# Patient Record
Sex: Female | Born: 1935 | Race: White | Hispanic: No | Smoking: Never smoker
Health system: Southern US, Community
[De-identification: ages and names within clinical notes are randomized; demographics above are authoritative.]

---

## 2004-06-16 ENCOUNTER — Ambulatory Visit: Payer: Self-pay | Admitting: Internal Medicine

## 2005-02-04 ENCOUNTER — Emergency Department: Payer: Self-pay | Admitting: Emergency Medicine

## 2005-02-28 ENCOUNTER — Inpatient Hospital Stay: Payer: Self-pay | Admitting: Internal Medicine

## 2005-02-28 ENCOUNTER — Other Ambulatory Visit: Payer: Self-pay

## 2006-03-27 ENCOUNTER — Encounter: Payer: Self-pay | Admitting: Orthopaedic Surgery

## 2006-04-03 ENCOUNTER — Encounter: Payer: Self-pay | Admitting: Orthopaedic Surgery

## 2006-04-18 ENCOUNTER — Other Ambulatory Visit: Payer: Self-pay

## 2006-04-18 ENCOUNTER — Emergency Department: Payer: Self-pay | Admitting: Unknown Physician Specialty

## 2006-04-26 ENCOUNTER — Ambulatory Visit: Payer: Self-pay | Admitting: Internal Medicine

## 2006-05-02 ENCOUNTER — Encounter: Payer: Self-pay | Admitting: Orthopaedic Surgery

## 2007-03-28 ENCOUNTER — Ambulatory Visit: Payer: Self-pay | Admitting: Internal Medicine

## 2008-09-12 ENCOUNTER — Ambulatory Visit: Payer: Self-pay | Admitting: Surgery

## 2008-09-18 ENCOUNTER — Ambulatory Visit: Payer: Self-pay | Admitting: Surgery

## 2010-01-11 ENCOUNTER — Ambulatory Visit: Payer: Self-pay | Admitting: Internal Medicine

## 2010-01-20 ENCOUNTER — Ambulatory Visit: Payer: Self-pay | Admitting: Unknown Physician Specialty

## 2010-01-22 ENCOUNTER — Ambulatory Visit: Payer: Self-pay | Admitting: Unknown Physician Specialty

## 2010-01-27 ENCOUNTER — Emergency Department: Payer: Self-pay | Admitting: Emergency Medicine

## 2010-01-30 ENCOUNTER — Observation Stay: Payer: Self-pay | Admitting: Internal Medicine

## 2010-04-07 ENCOUNTER — Ambulatory Visit: Payer: Self-pay | Admitting: Internal Medicine

## 2010-04-12 ENCOUNTER — Ambulatory Visit: Payer: Self-pay | Admitting: Internal Medicine

## 2010-10-27 ENCOUNTER — Ambulatory Visit: Payer: Self-pay | Admitting: Ophthalmology

## 2010-11-24 ENCOUNTER — Ambulatory Visit: Payer: Self-pay | Admitting: Ophthalmology

## 2012-01-30 ENCOUNTER — Emergency Department: Payer: Self-pay | Admitting: Emergency Medicine

## 2012-01-30 LAB — CBC WITH DIFFERENTIAL/PLATELET
Basophil #: 0.1 10*3/uL (ref 0.0–0.1)
Basophil %: 0.8 %
Eosinophil #: 0.1 10*3/uL (ref 0.0–0.7)
HCT: 38.4 % (ref 35.0–47.0)
HGB: 12.9 g/dL (ref 12.0–16.0)
Lymphocyte #: 1.5 10*3/uL (ref 1.0–3.6)
Lymphocyte %: 21.4 %
MCHC: 33.4 g/dL (ref 32.0–36.0)
MCV: 96 fL (ref 80–100)
Monocyte #: 0.7 x10 3/mm (ref 0.2–0.9)
Monocyte %: 9.5 %
Neutrophil #: 4.7 10*3/uL (ref 1.4–6.5)
RDW: 14.9 % — ABNORMAL HIGH (ref 11.5–14.5)
WBC: 7.1 10*3/uL (ref 3.6–11.0)

## 2012-01-31 LAB — COMPREHENSIVE METABOLIC PANEL
Alkaline Phosphatase: 89 U/L (ref 50–136)
Anion Gap: 6 — ABNORMAL LOW (ref 7–16)
BUN: 14 mg/dL (ref 7–18)
Bilirubin,Total: 0.9 mg/dL (ref 0.2–1.0)
Chloride: 106 mmol/L (ref 98–107)
Co2: 27 mmol/L (ref 21–32)
Creatinine: 1.36 mg/dL — ABNORMAL HIGH (ref 0.60–1.30)
EGFR (African American): 42 — ABNORMAL LOW
EGFR (Non-African Amer.): 36 — ABNORMAL LOW
Glucose: 104 mg/dL — ABNORMAL HIGH (ref 65–99)
Osmolality: 278 (ref 275–301)
Potassium: 3.5 mmol/L (ref 3.5–5.1)
Sodium: 139 mmol/L (ref 136–145)
Total Protein: 6.5 g/dL (ref 6.4–8.2)

## 2013-06-09 ENCOUNTER — Emergency Department: Payer: Self-pay | Admitting: Emergency Medicine

## 2013-06-09 LAB — BASIC METABOLIC PANEL
Anion Gap: 6 — ABNORMAL LOW (ref 7–16)
BUN: 16 mg/dL (ref 7–18)
CALCIUM: 9 mg/dL (ref 8.5–10.1)
CHLORIDE: 104 mmol/L (ref 98–107)
CO2: 28 mmol/L (ref 21–32)
Creatinine: 1.14 mg/dL (ref 0.60–1.30)
EGFR (African American): 52 — ABNORMAL LOW
EGFR (Non-African Amer.): 45 — ABNORMAL LOW
GLUCOSE: 83 mg/dL (ref 65–99)
Osmolality: 276 (ref 275–301)
Potassium: 4.1 mmol/L (ref 3.5–5.1)
Sodium: 138 mmol/L (ref 136–145)

## 2013-06-09 LAB — CBC
HCT: 42 % (ref 35.0–47.0)
HGB: 13.8 g/dL (ref 12.0–16.0)
MCH: 32.2 pg (ref 26.0–34.0)
MCHC: 33 g/dL (ref 32.0–36.0)
MCV: 98 fL (ref 80–100)
PLATELETS: 242 10*3/uL (ref 150–440)
RBC: 4.3 10*6/uL (ref 3.80–5.20)
RDW: 14.8 % — ABNORMAL HIGH (ref 11.5–14.5)
WBC: 8.8 10*3/uL (ref 3.6–11.0)

## 2013-06-12 DIAGNOSIS — M533 Sacrococcygeal disorders, not elsewhere classified: Secondary | ICD-10-CM | POA: Insufficient documentation

## 2013-08-21 DIAGNOSIS — I10 Essential (primary) hypertension: Secondary | ICD-10-CM | POA: Diagnosis present

## 2013-08-21 DIAGNOSIS — I2699 Other pulmonary embolism without acute cor pulmonale: Secondary | ICD-10-CM | POA: Diagnosis present

## 2013-08-21 DIAGNOSIS — M1990 Unspecified osteoarthritis, unspecified site: Secondary | ICD-10-CM | POA: Insufficient documentation

## 2013-08-21 DIAGNOSIS — E785 Hyperlipidemia, unspecified: Secondary | ICD-10-CM | POA: Diagnosis present

## 2013-08-21 DIAGNOSIS — I341 Nonrheumatic mitral (valve) prolapse: Secondary | ICD-10-CM | POA: Diagnosis present

## 2013-09-24 ENCOUNTER — Ambulatory Visit: Payer: Self-pay | Admitting: Emergency Medicine

## 2013-09-25 ENCOUNTER — Ambulatory Visit: Payer: Self-pay | Admitting: Neurology

## 2013-12-12 DIAGNOSIS — F0280 Dementia in other diseases classified elsewhere without behavioral disturbance: Secondary | ICD-10-CM | POA: Diagnosis present

## 2015-06-10 DIAGNOSIS — G4733 Obstructive sleep apnea (adult) (pediatric): Secondary | ICD-10-CM | POA: Diagnosis present

## 2017-12-18 DIAGNOSIS — N183 Chronic kidney disease, stage 3 unspecified: Secondary | ICD-10-CM | POA: Insufficient documentation

## 2020-03-30 ENCOUNTER — Emergency Department: Payer: Medicare Other

## 2020-03-30 ENCOUNTER — Encounter: Payer: Self-pay | Admitting: Internal Medicine

## 2020-03-30 ENCOUNTER — Other Ambulatory Visit: Payer: Self-pay

## 2020-03-30 ENCOUNTER — Inpatient Hospital Stay: Payer: Medicare Other

## 2020-03-30 ENCOUNTER — Inpatient Hospital Stay
Admission: EM | Admit: 2020-03-30 | Discharge: 2020-05-01 | DRG: 521 | Disposition: E | Payer: Medicare Other | Attending: Internal Medicine | Admitting: Internal Medicine

## 2020-03-30 DIAGNOSIS — E43 Unspecified severe protein-calorie malnutrition: Secondary | ICD-10-CM | POA: Diagnosis present

## 2020-03-30 DIAGNOSIS — N183 Chronic kidney disease, stage 3 unspecified: Secondary | ICD-10-CM

## 2020-03-30 DIAGNOSIS — G301 Alzheimer's disease with late onset: Secondary | ICD-10-CM | POA: Diagnosis present

## 2020-03-30 DIAGNOSIS — N179 Acute kidney failure, unspecified: Secondary | ICD-10-CM | POA: Diagnosis present

## 2020-03-30 DIAGNOSIS — G4733 Obstructive sleep apnea (adult) (pediatric): Secondary | ICD-10-CM | POA: Diagnosis present

## 2020-03-30 DIAGNOSIS — Z419 Encounter for procedure for purposes other than remedying health state, unspecified: Secondary | ICD-10-CM

## 2020-03-30 DIAGNOSIS — F419 Anxiety disorder, unspecified: Secondary | ICD-10-CM | POA: Diagnosis present

## 2020-03-30 DIAGNOSIS — X58XXXA Exposure to other specified factors, initial encounter: Secondary | ICD-10-CM | POA: Diagnosis present

## 2020-03-30 DIAGNOSIS — D72829 Elevated white blood cell count, unspecified: Secondary | ICD-10-CM | POA: Diagnosis present

## 2020-03-30 DIAGNOSIS — J9601 Acute respiratory failure with hypoxia: Secondary | ICD-10-CM | POA: Diagnosis not present

## 2020-03-30 DIAGNOSIS — N1832 Chronic kidney disease, stage 3b: Secondary | ICD-10-CM | POA: Diagnosis present

## 2020-03-30 DIAGNOSIS — J189 Pneumonia, unspecified organism: Secondary | ICD-10-CM | POA: Diagnosis not present

## 2020-03-30 DIAGNOSIS — R000 Tachycardia, unspecified: Secondary | ICD-10-CM | POA: Diagnosis not present

## 2020-03-30 DIAGNOSIS — E785 Hyperlipidemia, unspecified: Secondary | ICD-10-CM | POA: Diagnosis present

## 2020-03-30 DIAGNOSIS — I872 Venous insufficiency (chronic) (peripheral): Secondary | ICD-10-CM | POA: Diagnosis present

## 2020-03-30 DIAGNOSIS — Z20822 Contact with and (suspected) exposure to covid-19: Secondary | ICD-10-CM | POA: Diagnosis present

## 2020-03-30 DIAGNOSIS — I129 Hypertensive chronic kidney disease with stage 1 through stage 4 chronic kidney disease, or unspecified chronic kidney disease: Secondary | ICD-10-CM | POA: Diagnosis present

## 2020-03-30 DIAGNOSIS — D689 Coagulation defect, unspecified: Secondary | ICD-10-CM | POA: Diagnosis not present

## 2020-03-30 DIAGNOSIS — M1990 Unspecified osteoarthritis, unspecified site: Secondary | ICD-10-CM | POA: Diagnosis present

## 2020-03-30 DIAGNOSIS — Z86711 Personal history of pulmonary embolism: Secondary | ICD-10-CM

## 2020-03-30 DIAGNOSIS — Z66 Do not resuscitate: Secondary | ICD-10-CM | POA: Diagnosis not present

## 2020-03-30 DIAGNOSIS — Z6823 Body mass index (BMI) 23.0-23.9, adult: Secondary | ICD-10-CM | POA: Diagnosis not present

## 2020-03-30 DIAGNOSIS — H5789 Other specified disorders of eye and adnexa: Secondary | ICD-10-CM | POA: Diagnosis present

## 2020-03-30 DIAGNOSIS — I341 Nonrheumatic mitral (valve) prolapse: Secondary | ICD-10-CM | POA: Diagnosis present

## 2020-03-30 DIAGNOSIS — I2699 Other pulmonary embolism without acute cor pulmonale: Secondary | ICD-10-CM | POA: Diagnosis present

## 2020-03-30 DIAGNOSIS — M160 Bilateral primary osteoarthritis of hip: Secondary | ICD-10-CM | POA: Diagnosis present

## 2020-03-30 DIAGNOSIS — F05 Delirium due to known physiological condition: Secondary | ICD-10-CM | POA: Diagnosis not present

## 2020-03-30 DIAGNOSIS — S72001A Fracture of unspecified part of neck of right femur, initial encounter for closed fracture: Secondary | ICD-10-CM

## 2020-03-30 DIAGNOSIS — I10 Essential (primary) hypertension: Secondary | ICD-10-CM | POA: Diagnosis present

## 2020-03-30 DIAGNOSIS — F0280 Dementia in other diseases classified elsewhere without behavioral disturbance: Secondary | ICD-10-CM | POA: Diagnosis present

## 2020-03-30 DIAGNOSIS — I469 Cardiac arrest, cause unspecified: Secondary | ICD-10-CM | POA: Diagnosis not present

## 2020-03-30 DIAGNOSIS — G9341 Metabolic encephalopathy: Secondary | ICD-10-CM | POA: Diagnosis not present

## 2020-03-30 DIAGNOSIS — Z8249 Family history of ischemic heart disease and other diseases of the circulatory system: Secondary | ICD-10-CM

## 2020-03-30 DIAGNOSIS — R0902 Hypoxemia: Secondary | ICD-10-CM

## 2020-03-30 DIAGNOSIS — Z515 Encounter for palliative care: Secondary | ICD-10-CM

## 2020-03-30 DIAGNOSIS — Z7901 Long term (current) use of anticoagulants: Secondary | ICD-10-CM

## 2020-03-30 DIAGNOSIS — Z79899 Other long term (current) drug therapy: Secondary | ICD-10-CM

## 2020-03-30 DIAGNOSIS — M109 Gout, unspecified: Secondary | ICD-10-CM | POA: Diagnosis present

## 2020-03-30 DIAGNOSIS — I959 Hypotension, unspecified: Secondary | ICD-10-CM | POA: Diagnosis not present

## 2020-03-30 DIAGNOSIS — S72011A Unspecified intracapsular fracture of right femur, initial encounter for closed fracture: Principal | ICD-10-CM | POA: Diagnosis present

## 2020-03-30 LAB — CBC
HCT: 40.5 % (ref 36.0–46.0)
Hemoglobin: 13 g/dL (ref 12.0–15.0)
MCH: 31.6 pg (ref 26.0–34.0)
MCHC: 32.1 g/dL (ref 30.0–36.0)
MCV: 98.5 fL (ref 80.0–100.0)
Platelets: 269 10*3/uL (ref 150–400)
RBC: 4.11 MIL/uL (ref 3.87–5.11)
RDW: 14.2 % (ref 11.5–15.5)
WBC: 10.8 10*3/uL — ABNORMAL HIGH (ref 4.0–10.5)
nRBC: 0 % (ref 0.0–0.2)

## 2020-03-30 LAB — COMPREHENSIVE METABOLIC PANEL
ALT: 12 U/L (ref 0–44)
AST: 35 U/L (ref 15–41)
Albumin: 3 g/dL — ABNORMAL LOW (ref 3.5–5.0)
Alkaline Phosphatase: 89 U/L (ref 38–126)
Anion gap: 9 (ref 5–15)
BUN: 39 mg/dL — ABNORMAL HIGH (ref 8–23)
CO2: 21 mmol/L — ABNORMAL LOW (ref 22–32)
Calcium: 9.3 mg/dL (ref 8.9–10.3)
Chloride: 109 mmol/L (ref 98–111)
Creatinine, Ser: 1.81 mg/dL — ABNORMAL HIGH (ref 0.44–1.00)
GFR, Estimated: 26 mL/min — ABNORMAL LOW (ref >60)
Glucose, Bld: 156 mg/dL — ABNORMAL HIGH (ref 70–99)
Potassium: 4.7 mmol/L (ref 3.5–5.1)
Sodium: 139 mmol/L (ref 135–145)
Total Bilirubin: 1.6 mg/dL — ABNORMAL HIGH (ref 0.3–1.2)
Total Protein: 6.7 g/dL (ref 6.5–8.1)

## 2020-03-30 LAB — PROTIME-INR
INR: 3.5 — ABNORMAL HIGH (ref 0.8–1.2)
Prothrombin Time: 34 seconds — ABNORMAL HIGH (ref 11.4–15.2)

## 2020-03-30 LAB — APTT: aPTT: 44 seconds — ABNORMAL HIGH (ref 24–36)

## 2020-03-30 MED ORDER — ONDANSETRON HCL 4 MG/2ML IJ SOLN
4.0000 mg | Freq: Once | INTRAMUSCULAR | Status: AC
  Administered 2020-03-30: 4 mg via INTRAVENOUS
  Filled 2020-03-30: qty 2

## 2020-03-30 MED ORDER — SODIUM CHLORIDE 0.9 % IV SOLN: INTRAVENOUS | Status: AC

## 2020-03-30 MED ORDER — POLYETHYLENE GLYCOL 3350 17 G PO PACK
17.0000 g | PACK | Freq: Every day | ORAL | Status: DC | PRN
  Administered 2020-04-01: 17 g via ORAL
  Filled 2020-03-30: qty 1

## 2020-03-30 MED ORDER — MORPHINE SULFATE (PF) 2 MG/ML IV SOLN
2.0000 mg | Freq: Once | INTRAVENOUS | Status: AC
Start: 2020-03-30 — End: 2020-03-30
  Administered 2020-03-30: 2 mg via INTRAVENOUS
  Filled 2020-03-30: qty 1

## 2020-03-30 MED ORDER — MEMANTINE HCL 10 MG PO TABS
10.0000 mg | ORAL_TABLET | Freq: Two times a day (BID) | ORAL | Status: DC
  Administered 2020-03-31 – 2020-04-01 (×4): 10 mg via ORAL
  Filled 2020-03-30 (×3): qty 2
  Filled 2020-03-30 (×2): qty 1
  Filled 2020-03-30: qty 2

## 2020-03-30 MED ORDER — HYDROCODONE-ACETAMINOPHEN 5-325 MG PO TABS
1.0000 | ORAL_TABLET | Freq: Four times a day (QID) | ORAL | Status: DC | PRN
  Administered 2020-03-31 – 2020-04-01 (×4): 1 via ORAL
  Filled 2020-03-30 (×4): qty 1

## 2020-03-30 MED ORDER — METOPROLOL TARTRATE 25 MG PO TABS
25.0000 mg | ORAL_TABLET | Freq: Two times a day (BID) | ORAL | Status: DC
  Administered 2020-03-31 – 2020-04-01 (×4): 25 mg via ORAL
  Filled 2020-03-30 (×5): qty 1

## 2020-03-30 MED ORDER — SIMVASTATIN 20 MG PO TABS
40.0000 mg | ORAL_TABLET | Freq: Every day | ORAL | Status: DC
  Administered 2020-03-31 – 2020-04-01 (×2): 40 mg via ORAL
  Filled 2020-03-30 (×2): qty 2

## 2020-03-30 MED ORDER — HYDROMORPHONE HCL 1 MG/ML IJ SOLN
0.5000 mg | INTRAMUSCULAR | Status: DC | PRN
  Administered 2020-03-30 – 2020-04-02 (×6): 0.5 mg via INTRAVENOUS
  Filled 2020-03-30 (×6): qty 1

## 2020-03-30 MED ORDER — DONEPEZIL HCL 5 MG PO TABS
10.0000 mg | ORAL_TABLET | Freq: Every day | ORAL | Status: DC
  Administered 2020-03-31 – 2020-04-01 (×2): 10 mg via ORAL
  Filled 2020-03-30 (×3): qty 2

## 2020-03-30 NOTE — ED Triage Notes (Signed)
Per EMS pt is complaining of left sided hip pain, pt denies any falls.   EMS stated pt was seen for PT today but unable to determine why pt had PT

## 2020-03-30 NOTE — ED Provider Notes (Signed)
[facility] Emergency Department Provider Note   ____________________________________________    I have reviewed the triage vital signs and the nursing notes.   HISTORY  Chief Complaint Hip Pain (Per EMS pt complains of left sided hip pain)   History severely limited by dementia.  HPI ZYION LEIDNER is a 87 y.o. female with history of Alzheimer's dementia, who lives with her daughter and is cared for by her daughter.  Daughter reports patient has not been ambulating over the last week, had a telemedicine visit with PCP who recommended physical therapy however the patient has not been able to stand and bear weight.  Typically is able to ambulate well with her walker.  No falls reported.  Patient has a history of Alzheimer's dementia, chronic kidney disease, PE on Xarelto, osteoarthritis There are no problems to display for this patient.     Prior to Admission medications   Not on File     Allergies Patient has no allergy information on record.  No family history on file.  Social History Patient cared for by daughter, non-smoker    Review of systems Constitutional: No fevers reported Eyes: No discharge ENT: No epistaxis Cardiovascular: Denies chest pain. Respiratory: Denies shortness of breath. Gastrointestinal: No abdominal pain.  No nausea, no vomiting.   Genitourinary: Negative for dysuria. Musculoskeletal: Hip pain Skin: Negative for rash. Neurological: Negative for headaches   ____________________________________________   PHYSICAL EXAM:  VITAL SIGNS: ED Triage Vitals  Enc Vitals Group     BP 04-14-20 1832 (!) 179/96     Pulse Rate 04/14/20 1832 94     Resp 04/14/20 1832 18     Temp April 14, 2020 1832 99.3 F (37.4 C)     Temp Source April 14, 2020 1832 Oral     SpO2 14-Apr-2020 1832 98 %     Weight 2020-04-14 1836 54.4 kg (120 lb)     Height 04/14/2020 1836 1.524 m (5')     Head Circumference --      Peak Flow --      Pain Score --       Pain Loc --      Pain Edu? --      Excl. in GC? --     Constitutional: Alert, disoriented, no acute distress Eyes: Conjunctivae are normal.  Head: Atraumatic. Nose: No congestion/rhinnorhea. Mouth/Throat: Mucous membranes are moist.    Cardiovascular: Normal rate, regular rhythm.  Good peripheral circulation. Respiratory: Normal respiratory effort.  No retractions. Lungs CTAB. Gastrointestinal: Soft and nontender. No distention.  No CVA tenderness.  Musculoskeletal: Patient with painful range of motion of the lower extremities, complains of pain with axial load on both hips, left greater than right.  No tenderness to palpation of the left hip, 2+ pulses distally bilaterally Neurologic:  No gross focal neurologic deficits are appreciated.  Skin:  Skin is warm, dry and intact. No rash noted. Psychiatric: Mood and affect are normal. Speech and behavior are normal.  ____________________________________________   LABS (all labs ordered are listed, but only abnormal results are displayed)  Labs Reviewed  CBC - Abnormal; Notable for the following components:      Result Value   WBC 10.8 (*)    All other components within normal limits  SARS CORONAVIRUS 2 (TAT 6-24 HRS)  COMPREHENSIVE METABOLIC PANEL  APTT  PROTIME-INR   ____________________________________________  EKG   ____________________________________________  RADIOLOGY  Pelvis x-ray reviewed by me, right subcapital hip fracture ____________________________________________   PROCEDURES  Procedure(s) performed:  No  Procedures   Critical Care performed: No ____________________________________________   INITIAL IMPRESSION / ASSESSMENT AND PLAN / ED COURSE  Pertinent labs & imaging results that were available during my care of the patient were reviewed by me and considered in my medical decision making (see chart for details).  Patient presents with refusal to walk/inability to walk.  No trauma  reported.  Pain appears to be primarily left hip although possibly some pain in the right hip as well, daughter is confident there not been any falls   Pelvis x-ray demonstrates right subcapital hip fracture, patient treated with IV morphine and IV Zofran with good relief.  Discussed with Dr. Odis Luster of orthopedic surgery, patient is on Xarelto,  We will admit to the hospitalist service    ____________________________________________   FINAL CLINICAL IMPRESSION(S) / ED DIAGNOSES  Final diagnoses:  Closed fracture of right hip, initial encounter [facility])        Note:  This document was prepared using Dragon voice recognition software and may include unintentional dictation errors.   Jene Every, MD 03/27/2020 Barry Brunner

## 2020-03-30 NOTE — H&P (Signed)
History and Physical   Debra Edelmanunice N Shadix RUE:[id number]:[id number] DOB: 1928-09-07 DOA: 03/25/2020  PCP: Gracelyn NurseJohnston, John D, MD   Patient coming from: Home  Chief Complaint: Hip pain, not ambulating  HPI: Debra Orr is a 87 y.o. female with medical history significant of dementia, anxiety, cataracts, CKD 3, diverticulosis, hemorrhoids, hyperlipidemia, hypertension, PE on Xarelto, mitral valve prolapse, sleep apnea, venous insufficiency who presents with difficulty walking for about a week.  History obtained with the assistance of patient's daughter and chart review due to patient's dementia.  Patient lives alone but her daughter comes to check on her at least 3 times a week.  Her daughter states there is also another caregiver who helps on the other days. Patient was noted being unable to walk for about a week.  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  Upon evaluation by physical therapist patient was unable to bear weight due to pain and recommended that patient seek medical attention.  At baseline patient is able to walk with her walker.  Patient's daughter states that the patient has had decreased p.o. intake because she is not getting up and around recently.  Patient's daughter also states that she has concerns if the patient needs surgery as she had surgery 10 years ago and she had difficulty waking up after anesthesia.  Patient's daughter and patient states that not noticed any nausea, chest pain, shortness of breath, abdominal pain.   ED Course: Vital signs in ED significant for blood pressure in the 170 systolic.  Lab work-up showed BMP with bicarb 21, BUN 39, creatinine 1.81 which is elevated from her previous baseline of around 1.4.  LFTs showed albumin of 3, T bili 1.6.  CBC showed leukocytosis  to 10.8.  PT, PTT, INR were pending.  Respiratory panel flu COVID pending.  Chest x-ray showed a right femoral subcapital neck fracture.  Patient given pain control in the ED and orthopedics was consulted who will see the patient tomorrow.  Review of Systems: As per HPI otherwise all other systems reviewed and are negative.  History reviewed. No pertinent past medical history. Dementia Anxiety Cataract CKD Diverticulosis Hemorrhoids Hyperlipidemia Hypertension PE on Xarelto Gout Mitral valve prolapse Sleep apnea Venous insufficiency  History reviewed. No pertinent surgical history.  Social History  has no history on file for tobacco use, alcohol use, and drug use.  Not on File  Family History  Problem Relation Age of Onset  . Hypertension Mother   Reviewed on admission  Prior to Admission medications   Not on File  Allopurinol Donepezil 10 mg daily Namenda 10 mg twice daily Ativan 0.5 mg 3 times daily as needed Potassium segmentation Xarelto daily Simvastatin 40 mg daily Metoprolol 25 mg twice daily Ambien 5 mg nightly as needed  Physical Exam: Vitals:   03/16/2020 1832 03/05/2020 1836  BP: (!) 179/96   Pulse: 94   Resp: 18   Temp: 99.3 F (37.4 C)   TempSrc: Oral   SpO2: 98%   Weight:  54.4 kg  Height:  5' (1.524 m)   Physical Exam Constitutional:      General: She is not in acute distress.    Appearance: Normal appearance.     Comments: Somewhat tired appearing elderly female  HENT:     Head: Normocephalic and atraumatic.  Mouth/Throat:     Mouth: Mucous membranes are moist.     Pharynx: Oropharynx is clear.  Eyes:     Extraocular Movements: Extraocular movements intact.     Pupils: Pupils are equal, round, and reactive to light.  Cardiovascular:     Rate and Rhythm: Normal rate and regular rhythm.     Pulses: Normal pulses.     Heart sounds: Normal heart sounds.  Pulmonary:     Effort: Pulmonary effort is normal. No respiratory distress.      Breath sounds: Normal breath sounds.  Abdominal:     General: Bowel sounds are normal. There is no distension.     Palpations: Abdomen is soft.     Tenderness: There is no abdominal tenderness.  Musculoskeletal:        General: Tenderness (Right hip) present. No swelling or deformity.     Comments: Bilateral lower extremities are neurovascular intact  Skin:    General: Skin is warm and dry.  Neurological:     General: No focal deficit present.     Mental Status: Mental status is at baseline.    Labs on Admission: I have personally reviewed following labs and imaging studies  CBC: Recent Labs  Lab 03/06/2020 1903  WBC 10.8*  HGB 13.0  HCT 40.5  MCV 98.5  PLT 269    Basic Metabolic Panel: Recent Labs  Lab 03/05/2020 1903  NA 139  K 4.7  CL 109  CO2 21*  GLUCOSE 156*  BUN 39*  CREATININE 1.81*  CALCIUM 9.3    GFR: Estimated Creatinine Clearance: 15.1 mL/min (A) (by C-G formula based on SCr of 1.81 mg/dL (H)).  Liver Function Tests: Recent Labs  Lab 03/23/2020 1903  AST 35  ALT 12  ALKPHOS 89  BILITOT 1.6*  PROT 6.7  ALBUMIN 3.0*    Urine analysis: No results found for: COLORURINE, APPEARANCEUR, LABSPEC, PHURINE, GLUCOSEU, HGBUR, BILIRUBINUR, KETONESUR, PROTEINUR, UROBILINOGEN, NITRITE, LEUKOCYTESUR  Radiological Exams on Admission: Chest Portable 1 View  Result Date: 03/27/2020 CLINICAL DATA:  left-sided hip pain. EXAM: PORTABLE CHEST 1 VIEW COMPARISON:  January 30, 2010 FINDINGS: The heart size and mediastinal contours are mildly enlarged. Aortic knob calcifications are seen. There is elevation of the right hemidiaphragm. Increased interstitial markings seen at both lung apices bases, likely chronic lung changes. No large airspace consolidation or pleural effusion. No acute osseous abnormality. IMPRESSION: No active disease. Electronically Signed   By: Jonna Clark M.D.   On: 03/03/2020 20:12   DG Hip Unilat With Pelvis 2-3 Views Left  Result Date:  03/28/2020 CLINICAL DATA:  Left-sided hip pain EXAM: DG HIP (WITH OR WITHOUT PELVIS) 2-3V LEFT COMPARISON:  None. FINDINGS: Frontal view of the pelvis as well as frontal and frogleg lateral views of the left hip are obtained. The bones are diffusely osteopenic. There is evidence of a subcapital right femoral neck fracture. Please correlate with any history of prior trauma for current symptoms. Dedicated views of the right hip may be useful. The left hip is well aligned with no acute bony abnormality. There is moderate bilateral hip osteoarthritis. Significant lower lumbar spondylosis. A large amount of stool within the rectal vault. IMPRESSION: 1. Evidence of subcapital right femoral neck fracture as above. Dedicated right hip views may be useful. 2. No evidence of left hip fracture.  Severe osteopenia. 3. Bilateral hip osteoarthritis. Electronically Signed   By: Sharlet Salina M.D.   On: 03/12/2020 19:00   EKG: Independently reviewed.  Sinus tachycardia with  PVCs intraventricular conduction delay.  Significant baseline wander.  Q waves in V1 and V2 are present on EKG in 2015.  Assessment/Plan Principal Problem:   Closed subcapital fracture of femur, right, initial encounter (HCC) Active Problems:   Hyperlipidemia   Hypertension   Late onset Alzheimer's disease without behavioral disturbance (HCC)   Mitral valve prolapse   OSA (obstructive sleep apnea)   Pulmonary embolism (HCC)   Acute renal failure superimposed on stage 3 chronic kidney disease (HCC)  Subcapital femur fracture on the right > Noted on x-ray as patient was being evaluated for inability to ambulate at home and unable to bear weight for PT. > No known fall. > Orthopedics consulted in the ED and will see patient tomorrow, she will not have surgery tomorrow morning as she is on Xarelto for prior PE will need washout. - Admit to MedSurg - Pain control with as needed Norco and Dilaudid for breakthrough - Bedrest - Holding Xarelto -  Continuous pulse ox - Daughter states she would like to talk with surgeon before surgery as she has concerns about general anesthesia due to patient having difficulties waking up after general anesthesia 10 years ago.  AKI on CKD 3 > Creatinine elevated to 1.8 from a baseline of around 1.4 per chart review. - Give IV fluids overnight - Avoid nephrotoxic agents - Trend renal function electrolytes  Leukocytosis > No fever or clear signs of infection - Check chest x-ray and urinalysis  Dementia - Continue home donepezil and Namenda  History of arrhythmia (type not listed in chart) Hypertension > Hypertensive to the 170s systolic in ED - Not on home antihypertensives - This pain improved with pain control - Continue home metoprolol 25 mg twice daily - Consider PRN antihypertensive if this does not improve with her home metoprolol and pain control  Hyperlipidemia - Continue home simvastatin  Anxiety Sleep disturbance OSA - Holding home Ativan and Ambien considering her age and on pain control - Nonadherent to CPAP  Gout -Reportedly takes allopurinol however this was not on her most recent note in Care Everywhere.  Will await pharmacy med rec.  History of PE - Holding Xarelto for now as above  Eye irritation versus ?infection > Daughter reports history of this and reports she is on eyedrop - We will await pharmacy reconciliation to determine right eyedrops to continue  DVT prophylaxis: SCDs  Code Status:   Full Family Communication:  Discussed with daughter who is POA Disposition Plan:   Patient is from:  Home  Anticipated DC to:  Pending clinical course  Anticipated DC date:  2 to 3 days  Anticipated DC barriers: None  Consults called:  Orthopedics, Dr. Odis Luster consulted by EDP.   Admission status:  Inpatient, MedSurg   Severity of Illness: The appropriate patient status for this patient is OBSERVATION. Observation status is judged to be reasonable and necessary in  order to provide the required intensity of service to ensure the patient's safety. The patient's presenting symptoms, physical exam findings, and initial radiographic and laboratory data in the context of their medical condition is felt to place them at decreased risk for further clinical deterioration. Furthermore, it is anticipated that the patient will be medically stable for discharge from the hospital within 2 midnights of admission. The following factors support the patient status of observation.   " The patient's presenting symptoms include hip pain, difficulty walking. " The physical exam findings include right hip tenderness. " The initial radiographic and laboratory data are  creatinine 1.1, leukocytosis of 10.8, x-ray with subcapital right femur fracture.  Synetta Fail MD [facility]  How to contact the [facility] Attending or Consulting provider 7A - 7P or covering provider during after hours 7P -7A, for this patient?   1. Check the care team in [facility] and look for a) attending/consulting [facility] provider listed and b) the [facility] team listed 2. Log into [email or web address] and use [place]'s universal password to access. If you do not have the password, please contact the hospital operator. 3. Locate the [facility] provider you are looking for under [facility] and page to a number that you can be directly reached. 4. If you still have difficulty reaching the provider, please page the [facility] (Director on Call) for the Hospitalists listed on amion for assistance.  03/23/2020, 8:34 PM

## 2020-03-30 NOTE — ED Notes (Signed)
Patient transported to X-ray 

## 2020-03-31 ENCOUNTER — Encounter: Payer: Self-pay | Admitting: Internal Medicine

## 2020-03-31 ENCOUNTER — Other Ambulatory Visit: Payer: Self-pay

## 2020-03-31 DIAGNOSIS — S72011A Unspecified intracapsular fracture of right femur, initial encounter for closed fracture: Secondary | ICD-10-CM | POA: Diagnosis not present

## 2020-03-31 LAB — CBC
HCT: 35.4 % — ABNORMAL LOW (ref 36.0–46.0)
Hemoglobin: 11.9 g/dL — ABNORMAL LOW (ref 12.0–15.0)
MCH: 33.1 pg (ref 26.0–34.0)
MCHC: 33.6 g/dL (ref 30.0–36.0)
MCV: 98.6 fL (ref 80.0–100.0)
Platelets: 204 10*3/uL (ref 150–400)
RBC: 3.59 MIL/uL — ABNORMAL LOW (ref 3.87–5.11)
RDW: 14.2 % (ref 11.5–15.5)
WBC: 10.9 10*3/uL — ABNORMAL HIGH (ref 4.0–10.5)
nRBC: 0 % (ref 0.0–0.2)

## 2020-03-31 LAB — BASIC METABOLIC PANEL
Anion gap: 9 (ref 5–15)
BUN: 42 mg/dL — ABNORMAL HIGH (ref 8–23)
CO2: 21 mmol/L — ABNORMAL LOW (ref 22–32)
Calcium: 8.8 mg/dL — ABNORMAL LOW (ref 8.9–10.3)
Chloride: 114 mmol/L — ABNORMAL HIGH (ref 98–111)
Creatinine, Ser: 1.9 mg/dL — ABNORMAL HIGH (ref 0.44–1.00)
GFR, Estimated: 25 mL/min — ABNORMAL LOW (ref >60)
Glucose, Bld: 127 mg/dL — ABNORMAL HIGH (ref 70–99)
Potassium: 4.5 mmol/L (ref 3.5–5.1)
Sodium: 144 mmol/L (ref 135–145)

## 2020-03-31 LAB — TYPE AND SCREEN
ABO/RH(D): O POS
Antibody Screen: NEGATIVE

## 2020-03-31 LAB — SARS CORONAVIRUS 2 (TAT 6-24 HRS): SARS Coronavirus 2: NEGATIVE

## 2020-03-31 LAB — SURGICAL PCR SCREEN
MRSA, PCR: NEGATIVE
Staphylococcus aureus: NEGATIVE

## 2020-03-31 MED ORDER — ALLOPURINOL 100 MG PO TABS
100.0000 mg | ORAL_TABLET | Freq: Every day | ORAL | Status: DC
  Administered 2020-04-01: 10:00:00 100 mg via ORAL
  Filled 2020-03-31 (×5): qty 1

## 2020-03-31 MED ORDER — CIPROFLOXACIN HCL 0.3 % OP SOLN
1.0000 [drp] | Freq: Three times a day (TID) | OPHTHALMIC | Status: DC
  Administered 2020-03-31 – 2020-04-02 (×5): 1 [drp] via OPHTHALMIC
  Filled 2020-03-31 (×2): qty 2.5

## 2020-03-31 MED ORDER — ZOLPIDEM TARTRATE 5 MG PO TABS
5.0000 mg | ORAL_TABLET | Freq: Every evening | ORAL | Status: DC | PRN
  Administered 2020-03-31 – 2020-04-01 (×2): 5 mg via ORAL
  Filled 2020-03-31 (×2): qty 1

## 2020-03-31 MED ORDER — METOPROLOL TARTRATE 5 MG/5ML IV SOLN
5.0000 mg | Freq: Once | INTRAVENOUS | Status: AC
  Administered 2020-03-31: 5 mg via INTRAVENOUS
  Filled 2020-03-31: qty 5

## 2020-03-31 MED ORDER — LORAZEPAM 1 MG PO TABS: 0.5000 mg | ORAL_TABLET | Freq: Three times a day (TID) | ORAL | Status: DC | PRN

## 2020-03-31 NOTE — Progress Notes (Signed)
PROGRESS NOTE    Debra Orr  QPY:[id number] DOB: 01-31-1929 DOA: 03/22/2020 PCP: Gracelyn Nurse, MD   Brief Narrative:   87 y.o. female with medical history significant of dementia, anxiety, cataracts, CKD 3, diverticulosis, hemorrhoids, hyperlipidemia, hypertension, PE on Xarelto, mitral valve prolapse, sleep apnea, venous insufficiency who presents with difficulty walking for about a week.              History obtained with the assistance of patient's daughter and chart review due to patient's dementia.  Patient lives alone but her daughter comes to check on her at least 3 times a week.  Her daughter states there is also another caregiver who helps on the other days. Patient was noted being unable to walk for about a week.  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  Upon evaluation by physical therapist patient was unable to bear weight due to pain and recommended that patient seek medical attention.  At baseline patient is able to walk with her walker.   Assessment & Plan:   Principal Problem:   Closed subcapital fracture of femur, right, initial encounter (HCC) Active Problems:   Hyperlipidemia   Hypertension   Late onset Alzheimer's disease without behavioral disturbance (HCC)   Mitral valve prolapse   OSA (obstructive sleep apnea)   Pulmonary embolism (HCC)   Acute renal failure superimposed on stage 3 chronic kidney disease (HCC)  Right-sided subcapital femur fracture Noted on x-ray after inability walk for a week Also cannot bear weight No known fall Orthopedics consulted in ED Patient has been on Xarelto for PE Plan: Okay for diet Hold Xarelto Multimodal pain control Per orthopedic note plan for operative fixation 3/3  Acute kidney injury on chronic kidney  disease stage IIIb Creatinine elevated to 1.9 Baseline creatinine 1.4 Plan: Continue IV fluids for tonight Avoid nonessential nephrotoxins Daily BMP Consider nephrology consult if creatinine continues to rise Monitor urine output  Coagulopathy INR 3.5 Unclear etiology, possibly related to Xarelto Difficult to interpret coagulation parameters in the setting of NOAC use No obvious bleeding Plan: Xarelto on hold Daily INR  Dementia - Continue home donepezil and Namenda  History of arrhythmia (type not listed in chart) Hypertension > Hypertensive to the 170s systolic in ED - Not on home antihypertensives - This pain improved with pain control - Continue home metoprolol 25 mg twice daily - Consider PRN antihypertensive if this does not improve with her home metoprolol and pain control  Hyperlipidemia - Continue home simvastatin  Anxiety Sleep disturbance OSA - Continue home ambien and ativan - Cautious monitoring of respiratory status - Nonadherent to CPAP  Gout -Continue home allopurinol  History of PE - Holding Xarelto for now as above  Eye irritation versus ?infection > Daughter reports history of this and reports she is on eyedrop -Pharmacy verified patient is on ciprofloxacin eyedrop -We will continue per home dosing regimen   DVT prophylaxis: SCDs Code Status: Full Family Communication: None today.  Orthopedic surgery spoke with family Disposition Plan: Status is: Inpatient  Remains inpatient appropriate because:Inpatient level of care appropriate due to severity of illness   Dispo: The patient is from: Home              Anticipated d/c is to: SNF  Patient currently is not medically stable to d/c.   Difficult to place patient No  Hip fracture.  Plan for operative repair 3/3.  Orthopedics on consult.  Anticipate need for disposition to skilled nursing facility.     Level of care: Med-Surg  Consultants:   Orthopedic  surgery  Procedures:   None, hip fracture repair planned  Antimicrobials:   None   Subjective: Seen and examined.  History limited by underlying dementia.  Patient appears in no visible distress  Objective: Vitals:   03/31/20 1100 03/31/20 1200 03/31/20 1336 03/31/20 1535  BP: (!) 162/91 111/75 (!) 116/91 (!) 153/77  Pulse: 79 (!) 109 97 (!) 105  Resp:  20 18 17   Temp:  98 F (36.7 C) 98.6 F (37 C) 98.9 F (37.2 C)  TempSrc:  Oral    SpO2: 95% 97% 95% 94%  Weight:      Height:       No intake or output data in the 24 hours ending 03/31/20 1553 Filed Weights   03/29/2020 1836  Weight: 54.4 kg    Examination:  General exam: No acute distress.  Appears frail Respiratory system: Clear to auscultation. Respiratory effort normal. Cardiovascular system: S1 & S2 heard, RRR. No JVD, murmurs, rubs, gallops or clicks. No pedal edema. Gastrointestinal system: Abdomen is nondistended, soft and nontender. No organomegaly or masses felt. Normal bowel sounds heard. Central nervous system: Alert and oriented x1. No focal neurological deficits. Extremities: Tenderness to right hip.  Palpable pedal pulses bilaterally Skin: No rashes, lesions or ulcers Psychiatry: Judgement and insight appear impaired. Mood & affect flattened.     Data Reviewed: I have personally reviewed following labs and imaging studies  CBC: Recent Labs  Lab 03/22/2020 1903 03/31/20 0623  WBC 10.8* 10.9*  HGB 13.0 11.9*  HCT 40.5 35.4*  MCV 98.5 98.6  PLT 269 204   Basic Metabolic Panel: Recent Labs  Lab 03/10/2020 1903 03/31/20 0623  NA 139 144  K 4.7 4.5  CL 109 114*  CO2 21* 21*  GLUCOSE 156* 127*  BUN 39* 42*  CREATININE 1.81* 1.90*  CALCIUM 9.3 8.8*   GFR: Estimated Creatinine Clearance: 14.4 mL/min (A) (by C-G formula based on SCr of 1.9 mg/dL (H)). Liver Function Tests: Recent Labs  Lab 03/16/2020 1903  AST 35  ALT 12  ALKPHOS 89  BILITOT 1.6*  PROT 6.7  ALBUMIN 3.0*   No  results for input(s): LIPASE, AMYLASE in the last 168 hours. No results for input(s): AMMONIA in the last 168 hours. Coagulation Profile: Recent Labs  Lab 03/16/2020 1903  INR 3.5*   Cardiac Enzymes: No results for input(s): CKTOTAL, CKMB, CKMBINDEX, TROPONINI in the last 168 hours. BNP (last 3 results) No results for input(s): PROBNP in the last 8760 hours. HbA1C: No results for input(s): HGBA1C in the last 72 hours. CBG: No results for input(s): GLUCAP in the last 168 hours. Lipid Profile: No results for input(s): CHOL, HDL, LDLCALC, TRIG, CHOLHDL, LDLDIRECT in the last 72 hours. Thyroid Function Tests: No results for input(s): TSH, T4TOTAL, FREET4, T3FREE, THYROIDAB in the last 72 hours. Anemia Panel: No results for input(s): VITAMINB12, FOLATE, FERRITIN, TIBC, IRON, RETICCTPCT in the last 72 hours. Sepsis Labs: No results for input(s): PROCALCITON, LATICACIDVEN in the last 168 hours.  Recent Results (from the past 240 hour(s))  SARS CORONAVIRUS 2 (TAT 6-24 HRS) Nasopharyngeal Nasopharyngeal Swab     Status: None   Collection Time: 03/15/2020  7:21 PM   Specimen: Nasopharyngeal  Swab  Result Value Ref Range Status   SARS Coronavirus 2 NEGATIVE NEGATIVE Final    Comment: (NOTE) SARS-CoV-2 target nucleic acids are NOT DETECTED.  The SARS-CoV-2 RNA is generally detectable in upper and lower respiratory specimens during the acute phase of infection. Negative results do not preclude SARS-CoV-2 infection, do not rule out co-infections with other pathogens, and should not be used as the sole basis for treatment or other patient management decisions. Negative results must be combined with clinical observations, patient history, and epidemiological information. The expected result is Negative.  Fact Sheet for Patients: HairSlick.no  Fact Sheet for Healthcare Providers: quierodirigir.com  This test is not yet approved or  cleared by the [place] FDA and  has been authorized for detection and/or diagnosis of SARS-CoV-2 by FDA under an Emergency Use Authorization (EUA). This EUA will remain  in effect (meaning this test can be used) for the duration of the COVID-19 declaration under Se ction 564(b)(1) of the Act, 21 U.S.C. section 360bbb-3(b)(1), unless the authorization is terminated or revoked sooner.  Performed at [facility], [address] [place], [place] [address]          Radiology Studies: Chest Portable 1 View  Result Date: 03/29/2020 CLINICAL DATA:  left-sided hip pain. EXAM: PORTABLE CHEST 1 VIEW COMPARISON:  January 30, 2010 FINDINGS: The heart size and mediastinal contours are mildly enlarged. Aortic knob calcifications are seen. There is elevation of the right hemidiaphragm. Increased interstitial markings seen at both lung apices bases, likely chronic lung changes. No large airspace consolidation or pleural effusion. No acute osseous abnormality. IMPRESSION: No active disease. Electronically Signed   By: Jonna Clark M.D.   On: 03/03/2020 20:12   DG Hip Unilat With Pelvis 2-3 Views Left  Result Date: 03/04/2020 CLINICAL DATA:  Left-sided hip pain EXAM: DG HIP (WITH OR WITHOUT PELVIS) 2-3V LEFT COMPARISON:  None. FINDINGS: Frontal view of the pelvis as well as frontal and frogleg lateral views of the left hip are obtained. The bones are diffusely osteopenic. There is evidence of a subcapital right femoral neck fracture. Please correlate with any history of prior trauma for current symptoms. Dedicated views of the right hip may be useful. The left hip is well aligned with no acute bony abnormality. There is moderate bilateral hip osteoarthritis. Significant lower lumbar spondylosis. A large amount of stool within the rectal vault. IMPRESSION: 1. Evidence of subcapital right femoral neck fracture as above. Dedicated right hip views may be useful. 2. No evidence of left hip fracture.   Severe osteopenia. 3. Bilateral hip osteoarthritis. Electronically Signed   By: Sharlet Salina M.D.   On: 03/18/2020 19:00        Scheduled Meds: . allopurinol  100 mg Oral Daily  . ciprofloxacin  1 drop Left Eye TID  . donepezil  10 mg Oral Daily  . memantine  10 mg Oral BID  . metoprolol tartrate  25 mg Oral BID  . simvastatin  40 mg Oral q1800   Continuous Infusions: . sodium chloride 100 mL/hr at 03/31/20 0827     LOS: 1 day    Time spent: 25 minutes    Tresa Moore, MD [facility] Pager [phone]  If 7PM-7AM, please contact night-coverage 03/31/2020, 3:53 PM

## 2020-03-31 NOTE — ED Notes (Signed)
Rounds performed. Pt sleeping, on 2L [place], SPO2 @95 %. NS @100ml /hr. Alarm on

## 2020-03-31 NOTE — ED Notes (Signed)
Informed RN bed assigned again 1137

## 2020-03-31 NOTE — ED Notes (Signed)
Provider at bedside

## 2020-03-31 NOTE — ED Notes (Signed)
RN informed bed assigned [id number]

## 2020-03-31 NOTE — ED Notes (Signed)
Patient did not each much of her breakfast.  Approx. 10% of meal eaten.

## 2020-03-31 NOTE — Consult Note (Signed)
ORTHOPAEDIC CONSULTATION  REQUESTING PHYSICIAN: Synetta Fail, MD  Chief Complaint: right hip pain  HPI: Debra Orr is a 87 y.o. female who complains of right hip pain. Please see H&P and ED notes for details. Denies any numbness, tingling or constitutional symptoms.  History reviewed. No pertinent past medical history. History reviewed. No pertinent surgical history. Social History   Socioeconomic History  . Marital status: Widowed    Spouse name: Not on file  . Number of children: Not on file  . Years of education: Not on file  . Highest education level: Not on file  Occupational History  . Not on file  Tobacco Use  . Smoking status: Not on file  . Smokeless tobacco: Not on file  Substance and Sexual Activity  . Alcohol use: Not on file  . Drug use: Not on file  . Sexual activity: Not on file  Other Topics Concern  . Not on file  Social History Narrative  . Not on file   Social Determinants of Health   Financial Resource Strain: Not on file  Food Insecurity: Not on file  Transportation Needs: Not on file  Physical Activity: Not on file  Stress: Not on file  Social Connections: Not on file   Family History  Problem Relation Age of Onset  . Hypertension Mother   . Hypertension Father   . Heart attack Father   . Heart failure Father    Allergies  Allergen Reactions  . Tramadol Diarrhea    Severe Diarrhea   Prior to Admission medications   Medication Sig Start Date End Date Taking? Authorizing Provider  acetaminophen-codeine (TYLENOL #3) 300-30 MG tablet Take 1 tablet by mouth every 6 (six) hours as needed for pain. 03/23/20  Yes [provider]  allopurinol (ZYLOPRIM) 100 MG tablet Take 100 mg by mouth daily. 03/29/20  Yes [provider]  Calcium Carb-Cholecalciferol (OYSTER SHELL CALCIUM) 500-400 MG-UNIT TABS Take 1 tablet by mouth daily.   Yes [provider]  ciprofloxacin (CILOXAN) 0.3 % ophthalmic solution Place 1  drop into the left eye 3 (three) times daily. 03/25/20  Yes [provider]  donepezil (ARICEPT) 10 MG tablet Take 10 mg by mouth daily. 03/03/20  Yes [provider]  LORazepam (ATIVAN) 0.5 MG tablet Take 0.5 mg by mouth 3 (three) times daily as needed for anxiety. 02/23/20  Yes [provider]  memantine (NAMENDA) 10 MG tablet Take 10 mg by mouth 2 (two) times daily. 03/01/20  Yes [provider]  metoprolol tartrate (LOPRESSOR) 25 MG tablet Take 25 mg by mouth 2 (two) times daily. 03/29/20  Yes [provider]  Potassium Chloride CR (MICRO-K) 8 MEQ CPCR capsule CR Take 1 capsule by mouth daily. 12/28/19  Yes [provider]  simvastatin (ZOCOR) 40 MG tablet Take 40 mg by mouth at bedtime. 03/01/20  Yes [provider]  XARELTO 20 MG TABS tablet Take 20 mg by mouth daily with breakfast. 02/21/20  Yes [provider]  zolpidem (AMBIEN) 5 MG tablet Take 5 mg by mouth at bedtime as needed for sleep. 03/08/20  Yes [provider]   Chest Portable 1 View  Result Date: 04-19-2020 CLINICAL DATA:  left-sided hip pain. EXAM: PORTABLE CHEST 1 VIEW COMPARISON:  January 30, 2010 FINDINGS: The heart size and mediastinal contours are mildly enlarged. Aortic knob calcifications are seen. There is elevation of the right hemidiaphragm. Increased interstitial markings seen at both lung apices bases, likely chronic lung changes.  No large airspace consolidation or pleural effusion. No acute osseous abnormality. IMPRESSION: No active disease. Electronically Signed   By: Jonna Clark M.D.   On: 2020/04/29 20:12   DG Hip Unilat With Pelvis 2-3 Views Left  Result Date: 2020-04-29 CLINICAL DATA:  Left-sided hip pain EXAM: DG HIP (WITH OR WITHOUT PELVIS) 2-3V LEFT COMPARISON:  None. FINDINGS: Frontal view of the pelvis as well as frontal and frogleg lateral views of the left hip are obtained. The bones are diffusely osteopenic. There is evidence of a  subcapital right femoral neck fracture. Please correlate with any history of prior trauma for current symptoms. Dedicated views of the right hip may be useful. The left hip is well aligned with no acute bony abnormality. There is moderate bilateral hip osteoarthritis. Significant lower lumbar spondylosis. A large amount of stool within the rectal vault. IMPRESSION: 1. Evidence of subcapital right femoral neck fracture as above. Dedicated right hip views may be useful. 2. No evidence of left hip fracture.  Severe osteopenia. 3. Bilateral hip osteoarthritis. Electronically Signed   By: Sharlet Salina M.D.   On: 04-29-2020 19:00    Positive ROS: All other systems have been reviewed and were otherwise negative with the exception of those mentioned in the HPI and as above.  Physical Exam: General: Alert, no acute distress Cardiovascular: No pedal edema Respiratory: No cyanosis, no use of accessory musculature GI: No organomegaly, abdomen is soft and non-tender Skin: No lesions in the area of chief complaint Neurologic: Sensation intact distally Psychiatric: Patient is competent for consent with normal mood and affect Lymphatic: No axillary or cervical lymphadenopathy  MUSCULOSKELETAL: Right leg short, externally rotated, pain with IR/ER. Compartments soft. Good cap refill. Motor and sensory intact distally.  Assessment: Right hip femoral neck fracture  Plan: Plan a right hip hemiarthroplasty on Thursday 04/30/2020.  Hold Xarelto  The diagnosis, risks, benefits and alternatives to treatment are all discussed in detail with the patient and family. Risks include but are not limited to bleeding, infection, deep vein thrombosis, pulmonary embolism, nerve or vascular injury, non-union, repeat operation, persistent pain, weakness, stiffness and death. Her POA understands and is eager to proceed.     Lyndle Herrlich, MD    03/31/2020 1:21 PM

## 2020-03-31 NOTE — ED Notes (Signed)
Consent signed sent with chart

## 2020-03-31 DEATH — deceased

## 2020-04-01 DIAGNOSIS — S72011A Unspecified intracapsular fracture of right femur, initial encounter for closed fracture: Secondary | ICD-10-CM | POA: Diagnosis not present

## 2020-04-01 DIAGNOSIS — E43 Unspecified severe protein-calorie malnutrition: Secondary | ICD-10-CM

## 2020-04-01 DIAGNOSIS — N179 Acute kidney failure, unspecified: Secondary | ICD-10-CM | POA: Diagnosis not present

## 2020-04-01 DIAGNOSIS — I10 Essential (primary) hypertension: Secondary | ICD-10-CM

## 2020-04-01 DIAGNOSIS — N1832 Chronic kidney disease, stage 3b: Secondary | ICD-10-CM

## 2020-04-01 LAB — CBC WITH DIFFERENTIAL/PLATELET
Abs Immature Granulocytes: 0.11 10*3/uL — ABNORMAL HIGH (ref 0.00–0.07)
Basophils Absolute: 0 10*3/uL (ref 0.0–0.1)
Basophils Relative: 0 %
Eosinophils Absolute: 0.1 10*3/uL (ref 0.0–0.5)
Eosinophils Relative: 1 %
HCT: 35.2 % — ABNORMAL LOW (ref 36.0–46.0)
Hemoglobin: 11.3 g/dL — ABNORMAL LOW (ref 12.0–15.0)
Immature Granulocytes: 1 %
Lymphocytes Relative: 17 %
Lymphs Abs: 2 10*3/uL (ref 0.7–4.0)
MCH: 32.6 pg (ref 26.0–34.0)
MCHC: 32.1 g/dL (ref 30.0–36.0)
MCV: 101.4 fL — ABNORMAL HIGH (ref 80.0–100.0)
Monocytes Absolute: 1 10*3/uL (ref 0.1–1.0)
Monocytes Relative: 8 %
Neutro Abs: 8.6 10*3/uL — ABNORMAL HIGH (ref 1.7–7.7)
Neutrophils Relative %: 73 %
Platelets: 191 10*3/uL (ref 150–400)
RBC: 3.47 MIL/uL — ABNORMAL LOW (ref 3.87–5.11)
RDW: 14.6 % (ref 11.5–15.5)
WBC: 11.7 10*3/uL — ABNORMAL HIGH (ref 4.0–10.5)
nRBC: 0 % (ref 0.0–0.2)

## 2020-04-01 LAB — BASIC METABOLIC PANEL
Anion gap: 6 (ref 5–15)
BUN: 47 mg/dL — ABNORMAL HIGH (ref 8–23)
CO2: 24 mmol/L (ref 22–32)
Calcium: 8.6 mg/dL — ABNORMAL LOW (ref 8.9–10.3)
Chloride: 114 mmol/L — ABNORMAL HIGH (ref 98–111)
Creatinine, Ser: 1.67 mg/dL — ABNORMAL HIGH (ref 0.44–1.00)
GFR, Estimated: 29 mL/min — ABNORMAL LOW (ref >60)
Glucose, Bld: 96 mg/dL (ref 70–99)
Potassium: 4.2 mmol/L (ref 3.5–5.1)
Sodium: 144 mmol/L (ref 135–145)

## 2020-04-01 LAB — PROTIME-INR
INR: 1.5 — ABNORMAL HIGH (ref 0.8–1.2)
Prothrombin Time: 17.2 seconds — ABNORMAL HIGH (ref 11.4–15.2)

## 2020-04-01 LAB — ABO/RH: ABO/RH(D): O POS

## 2020-04-01 MED ORDER — ENSURE ENLIVE PO LIQD
237.0000 mL | Freq: Three times a day (TID) | ORAL | Status: DC
  Administered 2020-04-01: 21:00:00 237 mL via ORAL

## 2020-04-01 MED ORDER — CEFAZOLIN SODIUM-DEXTROSE 1-4 GM/50ML-% IV SOLN
1.0000 g | INTRAVENOUS | Status: AC
  Administered 2020-04-02: 1 g via INTRAVENOUS

## 2020-04-01 MED ORDER — ADULT MULTIVITAMIN W/MINERALS CH: 1.0000 | ORAL_TABLET | Freq: Every day | ORAL | Status: DC

## 2020-04-01 MED ORDER — TRANEXAMIC ACID 1000 MG/10ML IV SOLN
2000.0000 mg | INTRAVENOUS | Status: DC
  Filled 2020-04-01: qty 20

## 2020-04-01 NOTE — Progress Notes (Addendum)
Progress Note    Debra Orr  EXB:[id number] DOB: [birth year]  DOA: 03/04/2020 PCP: Gracelyn Nurse, MD      Brief Narrative:    Medical records reviewed and are as summarized below:  Debra Orr is a 87 y.o. female with medical history significant ofdementia, anxiety, cataracts, CKD 3, diverticulosis, hemorrhoids, hyperlipidemia, hypertension, PE on Xarelto, mitral valve prolapse, sleep apnea, venous insufficiency, presented to the hospital because of difficulty walking for about a week.      Assessment/Plan:   Principal Problem:   Closed subcapital fracture of femur, right, initial encounter (HCC) Active Problems:   Hyperlipidemia   Hypertension   Late onset Alzheimer's disease without behavioral disturbance (HCC)   Mitral valve prolapse   OSA (obstructive sleep apnea)   Pulmonary embolism (HCC)   Acute renal failure superimposed on stage 3 chronic kidney disease (HCC)   Protein-calorie malnutrition, severe   Nutrition Problem: Severe Malnutrition Etiology: chronic illness (dementia, CKD)  Signs/Symptoms: severe fat depletion,severe muscle depletion   Body mass index is 23.44 kg/m.     Right-sided subcapital femur fracture: Analgesics as needed for pain.  Plan for right hip surgery tomorrow.  Follow-up with orthopedic surgeon  AKI on CKD stage IIIb: Creatinine is trending down.  Continue to monitor.  Hypertension, history of arrhythmia, continue metoprolol  Dementia: Continue donepezil and Namenda.  History of pulmonary embolism: Xarelto on hold because of upcoming surgery.  Left eye irritation/infection: Continue ciprofloxacin eyedrops  Other comorbidities include hyperlipidemia, gout, anxiety, sleep dependence, OSA (nonadherent to CPAP)  Diet Order            Diet regular Room service appropriate? Yes; Fluid consistency: Thin  Diet effective now                    Consultants:  Orthopedic  surgeon  Procedures:  None    Medications:   . allopurinol  100 mg Oral Daily  . ciprofloxacin  1 drop Left Eye TID  . donepezil  10 mg Oral Daily  . feeding supplement  237 mL Oral TID BM  . memantine  10 mg Oral BID  . metoprolol tartrate  25 mg Oral BID  . [START ON 04/05/2020] multivitamin with minerals  1 tablet Oral Daily  . simvastatin  40 mg Oral q1800   Continuous Infusions:   Anti-infectives (From admission, onward)   None             Family Communication/Anticipated D/C date and plan/Code Status   DVT prophylaxis: SCDs Start: 03/22/2020 1944     Code Status: Full Code  Family Communication: Plan discussed with her daughter, Burna Mortimer Disposition Plan:    Status is: Inpatient  Remains inpatient appropriate because:Unsafe d/c plan   Dispo: The patient is from: Home              Anticipated d/c is to: SNF              Patient currently is not medically stable to d/c.   Difficult to place patient No           Subjective:   Interval events noted.  She appears confused and she is unable to provide an adequate history.  Objective:    Vitals:   04/01/20 0520 04/01/20 0738 04/01/20 1118 04/01/20 1545  BP: (!) 140/57 133/76 129/63 139/78  Pulse: 68 79 67 83  Resp: 14 20 18 18   Temp: 98.2 F (36.8 C) 98 F (36.7  C) 98.4 F (36.9 C) 97.6 F (36.4 C)  TempSrc: Oral  Oral Oral  SpO2: 95% 94% 92% 91%  Weight:      Height:       No data found.   Intake/Output Summary (Last 24 hours) at 04/01/2020 1621 Last data filed at 04/01/2020 1545 Gross per 24 hour  Intake 2120 ml  Output 600 ml  Net 1520 ml   Filed Weights   03/20/2020 1836  Weight: 54.4 kg    Exam:  GEN: NAD SKIN: No rash EYES: EOMI ENT: MMM CV: RRR PULM: CTA B ABD: soft, ND, NT, +BS CNS: AAO x 2, non focal EXT: No edema or tenderness        Data Reviewed:   I have personally reviewed following labs and imaging studies:  Labs: Labs show the following:    Basic Metabolic Panel: Recent Labs  Lab 03/12/2020 1903 03/31/20 0623 04/01/20 0502  NA 139 144 144  K 4.7 4.5 4.2  CL 109 114* 114*  CO2 21* 21* 24  GLUCOSE 156* 127* 96  BUN 39* 42* 47*  CREATININE 1.81* 1.90* 1.67*  CALCIUM 9.3 8.8* 8.6*   GFR Estimated Creatinine Clearance: 16.4 mL/min (A) (by C-G formula based on SCr of 1.67 mg/dL (H)). Liver Function Tests: Recent Labs  Lab 03/24/2020 1903  AST 35  ALT 12  ALKPHOS 89  BILITOT 1.6*  PROT 6.7  ALBUMIN 3.0*   No results for input(s): LIPASE, AMYLASE in the last 168 hours. No results for input(s): AMMONIA in the last 168 hours. Coagulation profile Recent Labs  Lab 03/21/2020 1903 04/01/20 0502  INR 3.5* 1.5*    CBC: Recent Labs  Lab 03/11/2020 1903 03/31/20 0623 04/01/20 0502  WBC 10.8* 10.9* 11.7*  NEUTROABS  --   --  8.6*  HGB 13.0 11.9* 11.3*  HCT 40.5 35.4* 35.2*  MCV 98.5 98.6 101.4*  PLT 269 204 191   Cardiac Enzymes: No results for input(s): CKTOTAL, CKMB, CKMBINDEX, TROPONINI in the last 168 hours. BNP (last 3 results) No results for input(s): PROBNP in the last 8760 hours. CBG: No results for input(s): GLUCAP in the last 168 hours. D-Dimer: No results for input(s): DDIMER in the last 72 hours. Hgb A1c: No results for input(s): HGBA1C in the last 72 hours. Lipid Profile: No results for input(s): CHOL, HDL, LDLCALC, TRIG, CHOLHDL, LDLDIRECT in the last 72 hours. Thyroid function studies: No results for input(s): TSH, T4TOTAL, T3FREE, THYROIDAB in the last 72 hours.  Invalid input(s): FREET3 Anemia work up: No results for input(s): VITAMINB12, FOLATE, FERRITIN, TIBC, IRON, RETICCTPCT in the last 72 hours. Sepsis Labs: Recent Labs  Lab 03/06/2020 1903 03/31/20 0623 04/01/20 0502  WBC 10.8* 10.9* 11.7*    Microbiology Recent Results (from the past 240 hour(s))  SARS CORONAVIRUS 2 (TAT 6-24 HRS) Nasopharyngeal Nasopharyngeal Swab     Status: None   Collection Time: 03/03/2020  7:21 PM    Specimen: Nasopharyngeal Swab  Result Value Ref Range Status   SARS Coronavirus 2 NEGATIVE NEGATIVE Final    Comment: (NOTE) SARS-CoV-2 target nucleic acids are NOT DETECTED.  The SARS-CoV-2 RNA is generally detectable in upper and lower respiratory specimens during the acute phase of infection. Negative results do not preclude SARS-CoV-2 infection, do not rule out co-infections with other pathogens, and should not be used as the sole basis for treatment or other patient management decisions. Negative results must be combined with clinical observations, patient history, and epidemiological information. The expected result  is Negative.  Fact Sheet for Patients: HairSlick.no  Fact Sheet for Healthcare Providers: quierodirigir.com  This test is not yet approved or cleared by the [place] FDA and  has been authorized for detection and/or diagnosis of SARS-CoV-2 by FDA under an Emergency Use Authorization (EUA). This EUA will remain  in effect (meaning this test can be used) for the duration of the COVID-19 declaration under Se ction 564(b)(1) of the Act, 21 U.S.C. section 360bbb-3(b)(1), unless the authorization is terminated or revoked sooner.  Performed at [facility] [place] [facility], [address], [place], [place] [address]   Surgical PCR screen     Status: None   Collection Time: 03/31/20  2:26 PM   Specimen: Nasal Mucosa; Nasal Swab  Result Value Ref Range Status   MRSA, PCR NEGATIVE NEGATIVE Final   Staphylococcus aureus NEGATIVE NEGATIVE Final    Comment: (NOTE) The Xpert SA Assay (FDA approved for NASAL specimens in patients 67 years of age and older), is one component of a comprehensive surveillance program. It is not intended to diagnose infection nor to guide or monitor treatment. Performed at [facility], [address], [place], [place] [address]     Procedures and diagnostic studies:  Chest  Portable 1 View  Result Date: 03/04/2020 CLINICAL DATA:  left-sided hip pain. EXAM: PORTABLE CHEST 1 VIEW COMPARISON:  January 30, 2010 FINDINGS: The heart size and mediastinal contours are mildly enlarged. Aortic knob calcifications are seen. There is elevation of the right hemidiaphragm. Increased interstitial markings seen at both lung apices bases, likely chronic lung changes. No large airspace consolidation or pleural effusion. No acute osseous abnormality. IMPRESSION: No active disease. Electronically Signed   By: Jonna Clark M.D.   On: 03/20/2020 20:12   DG Hip Unilat With Pelvis 2-3 Views Left  Result Date: 03/24/2020 CLINICAL DATA:  Left-sided hip pain EXAM: DG HIP (WITH OR WITHOUT PELVIS) 2-3V LEFT COMPARISON:  None. FINDINGS: Frontal view of the pelvis as well as frontal and frogleg lateral views of the left hip are obtained. The bones are diffusely osteopenic. There is evidence of a subcapital right femoral neck fracture. Please correlate with any history of prior trauma for current symptoms. Dedicated views of the right hip may be useful. The left hip is well aligned with no acute bony abnormality. There is moderate bilateral hip osteoarthritis. Significant lower lumbar spondylosis. A large amount of stool within the rectal vault. IMPRESSION: 1. Evidence of subcapital right femoral neck fracture as above. Dedicated right hip views may be useful. 2. No evidence of left hip fracture.  Severe osteopenia. 3. Bilateral hip osteoarthritis. Electronically Signed   By: Sharlet Salina M.D.   On: 03/29/2020 19:00               LOS: 2 days   Corleone Biegler  [facility]   Pager on [email or web address]. If 7PM-7AM, please contact night-coverage at [email or web address]     04/01/2020, 4:21 PM

## 2020-04-01 NOTE — Progress Notes (Signed)
Initial Nutrition Assessment  DOCUMENTATION CODES:   Severe malnutrition in context of chronic illness  INTERVENTION:  Provide Ensure Enlive po TID, each supplement provides 350 kcal and 20 grams of protein.  Provide MVI po daily.  NUTRITION DIAGNOSIS:   Severe Malnutrition related to chronic illness (dementia, CKD) as evidenced by severe fat depletion,severe muscle depletion.  GOAL:   Patient will meet greater than or equal to 90% of their needs  MONITOR:   PO intake,Supplement acceptance,Labs,Weight trends,I & O's  REASON FOR ASSESSMENT:   Consult Assessment of nutrition requirement/status  ASSESSMENT:   87 year old female with PMHx of dementia, anxiety, CKD stage III, diverticulosis, hemorrhoids, HLD, HTN, PE on Xarelto, mitral valve prolapse, sleep apnea, venous insufficiency who is admitted with right-sided subcapital femur fracture, AKI on CKD stage III.   Met with patient at bedside this morning. Patient unable to provide any history. No family members available at time of RD assessment. NT was present trying to assist patient with breakfast but patient reported she only wanted water to drink. Per chart patient ate 50% of her lunch today. Plan is for surgical fixation tomorrow in OR. Patient would benefit from ONS to help meet calorie/protein needs.   No weight history available in chart to trend. Patient currently documented to be 54.4 kg (120 lbs).  Medications reviewed and include: ciprofloxacin.  Labs reviewed: Chloride 114, BUN 47, Creatinine 1.67.  NUTRITION - FOCUSED PHYSICAL EXAM:  Flowsheet Row Most Recent Value  Orbital Region Severe depletion  Upper Arm Region Severe depletion  Thoracic and Lumbar Region Moderate depletion  Buccal Region Severe depletion  Temple Region Severe depletion  Clavicle Bone Region Severe depletion  Clavicle and Acromion Bone Region Severe depletion  Scapular Bone Region Moderate depletion  Dorsal Hand Severe depletion   Patellar Region Severe depletion  Anterior Thigh Region Severe depletion  Posterior Calf Region Severe depletion  Edema (RD Assessment) None  Hair Reviewed  Eyes Reviewed  Mouth Reviewed  Skin Reviewed  Nails Reviewed     Diet Order:   Diet Order            Diet regular Room service appropriate? Yes; Fluid consistency: Thin  Diet effective now                EDUCATION NEEDS:   No education needs have been identified at this time  Skin:  Skin Assessment: Reviewed RN Assessment  Last BM:  Unknown  Height:   Ht Readings from Last 1 Encounters:  03/03/2020 5' (1.524 m)   Weight:   Wt Readings from Last 1 Encounters:  03/29/2020 54.4 kg   BMI:  Body mass index is 23.44 kg/m.  Estimated Nutritional Needs:   Kcal:  1500-1700  Protein:  75-85 grams  Fluid:  1.5 L/day  Jacklynn Barnacle, MS, RD, LDN Pager number available on Amion

## 2020-04-02 ENCOUNTER — Inpatient Hospital Stay: Payer: Medicare Other | Admitting: Anesthesiology

## 2020-04-02 ENCOUNTER — Inpatient Hospital Stay: Payer: Medicare Other

## 2020-04-02 ENCOUNTER — Encounter: Payer: Self-pay | Admitting: Internal Medicine

## 2020-04-02 ENCOUNTER — Encounter: Admission: EM | Disposition: E | Payer: Self-pay | Source: Home / Self Care | Attending: Internal Medicine

## 2020-04-02 DIAGNOSIS — S72011A Unspecified intracapsular fracture of right femur, initial encounter for closed fracture: Secondary | ICD-10-CM | POA: Diagnosis not present

## 2020-04-02 HISTORY — PX: HIP ARTHROPLASTY: SHX981

## 2020-04-02 LAB — PROCALCITONIN: Procalcitonin: 0.14 ng/mL

## 2020-04-02 LAB — BLOOD GAS, ARTERIAL
Acid-base deficit: 5.3 mmol/L — ABNORMAL HIGH (ref 0.0–2.0)
Bicarbonate: 18.8 mmol/L — ABNORMAL LOW (ref 20.0–28.0)
O2 Saturation: 86 %
Patient temperature: 37
pCO2 arterial: 31 mmHg — ABNORMAL LOW (ref 32.0–48.0)
pH, Arterial: 7.39 (ref 7.350–7.450)
pO2, Arterial: 52 mmHg — ABNORMAL LOW (ref 83.0–108.0)

## 2020-04-02 LAB — CBC
HCT: 37 % (ref 36.0–46.0)
Hemoglobin: 11.9 g/dL — ABNORMAL LOW (ref 12.0–15.0)
MCH: 32.5 pg (ref 26.0–34.0)
MCHC: 32.2 g/dL (ref 30.0–36.0)
MCV: 101.1 fL — ABNORMAL HIGH (ref 80.0–100.0)
Platelets: 240 10*3/uL (ref 150–400)
RBC: 3.66 MIL/uL — ABNORMAL LOW (ref 3.87–5.11)
RDW: 14.8 % (ref 11.5–15.5)
WBC: 21 10*3/uL — ABNORMAL HIGH (ref 4.0–10.5)
nRBC: 0.2 % (ref 0.0–0.2)

## 2020-04-02 LAB — BASIC METABOLIC PANEL
Anion gap: 5 (ref 5–15)
Anion gap: 10 (ref 5–15)
BUN: 38 mg/dL — ABNORMAL HIGH (ref 8–23)
BUN: 34 mg/dL — ABNORMAL HIGH (ref 8–23)
CO2: 24 mmol/L (ref 22–32)
CO2: 20 mmol/L — ABNORMAL LOW (ref 22–32)
Calcium: 8.5 mg/dL — ABNORMAL LOW (ref 8.9–10.3)
Calcium: 8.7 mg/dL — ABNORMAL LOW (ref 8.9–10.3)
Chloride: 115 mmol/L — ABNORMAL HIGH (ref 98–111)
Chloride: 114 mmol/L — ABNORMAL HIGH (ref 98–111)
Creatinine, Ser: 1.56 mg/dL — ABNORMAL HIGH (ref 0.44–1.00)
Creatinine, Ser: 1.48 mg/dL — ABNORMAL HIGH (ref 0.44–1.00)
GFR, Estimated: 32 mL/min — ABNORMAL LOW (ref >60)
GFR, Estimated: 34 mL/min — ABNORMAL LOW (ref >60)
Glucose, Bld: 122 mg/dL — ABNORMAL HIGH (ref 70–99)
Glucose, Bld: 162 mg/dL — ABNORMAL HIGH (ref 70–99)
Potassium: 4.4 mmol/L (ref 3.5–5.1)
Potassium: 4.6 mmol/L (ref 3.5–5.1)
Sodium: 144 mmol/L (ref 135–145)
Sodium: 144 mmol/L (ref 135–145)

## 2020-04-02 LAB — CBC WITH DIFFERENTIAL/PLATELET
Abs Immature Granulocytes: 0.16 10*3/uL — ABNORMAL HIGH (ref 0.00–0.07)
Basophils Absolute: 0.1 10*3/uL (ref 0.0–0.1)
Basophils Relative: 1 %
Eosinophils Absolute: 0.1 10*3/uL (ref 0.0–0.5)
Eosinophils Relative: 0 %
HCT: 34.2 % — ABNORMAL LOW (ref 36.0–46.0)
Hemoglobin: 11.3 g/dL — ABNORMAL LOW (ref 12.0–15.0)
Immature Granulocytes: 1 %
Lymphocytes Relative: 9 %
Lymphs Abs: 1.1 10*3/uL (ref 0.7–4.0)
MCH: 32.8 pg (ref 26.0–34.0)
MCHC: 33 g/dL (ref 30.0–36.0)
MCV: 99.4 fL (ref 80.0–100.0)
Monocytes Absolute: 0.8 10*3/uL (ref 0.1–1.0)
Monocytes Relative: 7 %
Neutro Abs: 10.3 10*3/uL — ABNORMAL HIGH (ref 1.7–7.7)
Neutrophils Relative %: 82 %
Platelets: 206 10*3/uL (ref 150–400)
RBC: 3.44 MIL/uL — ABNORMAL LOW (ref 3.87–5.11)
RDW: 14.6 % (ref 11.5–15.5)
WBC: 12.5 10*3/uL — ABNORMAL HIGH (ref 4.0–10.5)
nRBC: 0.2 % (ref 0.0–0.2)

## 2020-04-02 LAB — PROTIME-INR
INR: 1.4 — ABNORMAL HIGH (ref 0.8–1.2)
INR: 1.5 — ABNORMAL HIGH (ref 0.8–1.2)
Prothrombin Time: 16.9 seconds — ABNORMAL HIGH (ref 11.4–15.2)
Prothrombin Time: 17.1 seconds — ABNORMAL HIGH (ref 11.4–15.2)

## 2020-04-02 LAB — TROPONIN I (HIGH SENSITIVITY): Troponin I (High Sensitivity): 83 ng/L — ABNORMAL HIGH (ref <18)

## 2020-04-02 LAB — GLUCOSE, CAPILLARY: Glucose-Capillary: 131 mg/dL — ABNORMAL HIGH (ref 70–99)

## 2020-04-02 LAB — BRAIN NATRIURETIC PEPTIDE: B Natriuretic Peptide: 227.3 pg/mL — ABNORMAL HIGH (ref 0.0–100.0)

## 2020-04-02 LAB — MAGNESIUM: Magnesium: 1.7 mg/dL (ref 1.7–2.4)

## 2020-04-02 SURGERY — HEMIARTHROPLASTY, HIP, DIRECT ANTERIOR APPROACH, FOR FRACTURE
Anesthesia: Spinal | Site: Hip | Laterality: Right

## 2020-04-02 MED ORDER — CEFAZOLIN SODIUM-DEXTROSE 1-4 GM/50ML-% IV SOLN
1.0000 g | Freq: Four times a day (QID) | INTRAVENOUS | Status: DC
  Filled 2020-04-02 (×3): qty 50

## 2020-04-02 MED ORDER — LORAZEPAM 2 MG/ML PO CONC
1.0000 mg | ORAL | Status: DC | PRN
  Filled 2020-04-02: qty 0.5

## 2020-04-02 MED ORDER — BUPIVACAINE HCL (PF) 0.5 % IJ SOLN
INTRAMUSCULAR | Status: AC
  Filled 2020-04-02: qty 20

## 2020-04-02 MED ORDER — MORPHINE SULFATE (PF) 2 MG/ML IV SOLN
2.0000 mg | INTRAVENOUS | Status: DC | PRN
  Administered 2020-04-02: 2 mg via INTRAVENOUS
  Filled 2020-04-02: qty 1

## 2020-04-02 MED ORDER — FENTANYL CITRATE (PF) 100 MCG/2ML IJ SOLN
INTRAMUSCULAR | Status: AC
  Filled 2020-04-02: qty 2

## 2020-04-02 MED ORDER — DEXMEDETOMIDINE (PRECEDEX) IN NS 20 MCG/5ML (4 MCG/ML) IV SYRINGE
PREFILLED_SYRINGE | INTRAVENOUS | Status: DC | PRN
  Administered 2020-04-02: 4 ug via INTRAVENOUS

## 2020-04-02 MED ORDER — SODIUM CHLORIDE 0.9 % IV SOLN: INTRAVENOUS | Status: DC | PRN

## 2020-04-02 MED ORDER — DEXMEDETOMIDINE (PRECEDEX) IN NS 20 MCG/5ML (4 MCG/ML) IV SYRINGE
PREFILLED_SYRINGE | INTRAVENOUS | Status: AC
  Filled 2020-04-02: qty 5

## 2020-04-02 MED ORDER — ONDANSETRON HCL 4 MG PO TABS: 4.0000 mg | ORAL_TABLET | Freq: Four times a day (QID) | ORAL | Status: DC | PRN

## 2020-04-02 MED ORDER — PHENYLEPHRINE HCL (PRESSORS) 10 MG/ML IV SOLN
INTRAVENOUS | Status: DC | PRN
  Administered 2020-04-02: 50 ug via INTRAVENOUS
  Administered 2020-04-02 (×3): 100 ug via INTRAVENOUS

## 2020-04-02 MED ORDER — FENTANYL CITRATE (PF) 100 MCG/2ML IJ SOLN: 100.0000 ug | INTRAMUSCULAR | Status: DC

## 2020-04-02 MED ORDER — CHLORHEXIDINE GLUCONATE 0.12 % MT SOLN: 15.0000 mL | Freq: Two times a day (BID) | OROMUCOSAL | Status: DC

## 2020-04-02 MED ORDER — EPHEDRINE SULFATE 50 MG/ML IJ SOLN: INTRAMUSCULAR | Status: DC | PRN

## 2020-04-02 MED ORDER — BUPIVACAINE-EPINEPHRINE (PF) 0.25% -1:200000 IJ SOLN
INTRAMUSCULAR | Status: DC | PRN
  Administered 2020-04-02: 20 mL via PERINEURAL

## 2020-04-02 MED ORDER — METOPROLOL TARTRATE 5 MG/5ML IV SOLN: 1.0000 mg | Freq: Once | INTRAVENOUS | Status: DC

## 2020-04-02 MED ORDER — MIDAZOLAM HCL 5 MG/5ML IJ SOLN
INTRAMUSCULAR | Status: DC | PRN
  Administered 2020-04-02: 1 mg via INTRAVENOUS

## 2020-04-02 MED ORDER — PROPOFOL 500 MG/50ML IV EMUL
INTRAVENOUS | Status: AC
  Filled 2020-04-02: qty 50

## 2020-04-02 MED ORDER — BUPIVACAINE-EPINEPHRINE (PF) 0.25% -1:200000 IJ SOLN
INTRAMUSCULAR | Status: AC
  Filled 2020-04-02: qty 30

## 2020-04-02 MED ORDER — SODIUM CHLORIDE 0.9 % IV BOLUS
250.0000 mL | Freq: Once | INTRAVENOUS | Status: AC
  Administered 2020-04-02: 250 mL via INTRAVENOUS

## 2020-04-02 MED ORDER — METOCLOPRAMIDE HCL 5 MG PO TABS
5.0000 mg | ORAL_TABLET | Freq: Three times a day (TID) | ORAL | Status: DC | PRN
  Filled 2020-04-02: qty 2

## 2020-04-02 MED ORDER — LIDOCAINE HCL (PF) 1 % IJ SOLN
INTRAMUSCULAR | Status: DC | PRN
  Administered 2020-04-02: 3 mL

## 2020-04-02 MED ORDER — ONDANSETRON HCL 4 MG/2ML IJ SOLN
INTRAMUSCULAR | Status: DC | PRN
  Administered 2020-04-02: 4 mg via INTRAVENOUS

## 2020-04-02 MED ORDER — ETOMIDATE 2 MG/ML IV SOLN
20.0000 mg | INTRAVENOUS | Status: DC
  Filled 2020-04-02: qty 10

## 2020-04-02 MED ORDER — OXYCODONE HCL 5 MG/5ML PO SOLN: 5.0000 mg | Freq: Once | ORAL | Status: DC | PRN

## 2020-04-02 MED ORDER — LACTATED RINGERS IV SOLN: INTRAVENOUS | Status: DC

## 2020-04-02 MED ORDER — METOCLOPRAMIDE HCL 5 MG/ML IJ SOLN: 5.0000 mg | Freq: Three times a day (TID) | INTRAMUSCULAR | Status: DC | PRN

## 2020-04-02 MED ORDER — OXYCODONE HCL 5 MG PO TABS: 5.0000 mg | ORAL_TABLET | Freq: Once | ORAL | Status: DC | PRN

## 2020-04-02 MED ORDER — PHENYLEPHRINE HCL-NACL 10-0.9 MG/250ML-% IV SOLN
25.0000 ug/min | INTRAVENOUS | Status: DC
  Administered 2020-04-02: 25 ug/min via INTRAVENOUS
  Filled 2020-04-02: qty 250

## 2020-04-02 MED ORDER — BUPIVACAINE HCL (PF) 0.5 % IJ SOLN
INTRAMUSCULAR | Status: DC | PRN
  Administered 2020-04-02: 2 mL

## 2020-04-02 MED ORDER — PROPOFOL 10 MG/ML IV BOLUS
INTRAVENOUS | Status: DC | PRN
  Administered 2020-04-02: 40 mg via INTRAVENOUS

## 2020-04-02 MED ORDER — ORAL CARE MOUTH RINSE: 15.0000 mL | Freq: Two times a day (BID) | OROMUCOSAL | Status: DC

## 2020-04-02 MED ORDER — METOPROLOL TARTRATE 5 MG/5ML IV SOLN
INTRAVENOUS | Status: AC
  Filled 2020-04-02: qty 5

## 2020-04-02 MED ORDER — IOHEXOL 350 MG/ML SOLN
60.0000 mL | Freq: Once | INTRAVENOUS | Status: AC | PRN
  Administered 2020-04-02: 60 mL via INTRAVENOUS

## 2020-04-02 MED ORDER — KETAMINE HCL 10 MG/ML IJ SOLN
INTRAMUSCULAR | Status: DC | PRN
  Administered 2020-04-02: 20 mg via INTRAVENOUS
  Administered 2020-04-02 (×3): 10 mg via INTRAVENOUS

## 2020-04-02 MED ORDER — FENTANYL CITRATE (PF) 100 MCG/2ML IJ SOLN: 25.0000 ug | INTRAMUSCULAR | Status: DC | PRN

## 2020-04-02 MED ORDER — SODIUM CHLORIDE 0.9 % IV SOLN: 250.0000 mL | INTRAVENOUS | Status: DC

## 2020-04-02 MED ORDER — ROCURONIUM BROMIDE 50 MG/5ML IV SOLN
50.0000 mg | INTRAVENOUS | Status: DC
  Filled 2020-04-02: qty 1

## 2020-04-02 MED ORDER — LACTATED RINGERS IV SOLN: INTRAVENOUS | Status: DC | PRN

## 2020-04-02 MED ORDER — CEFAZOLIN SODIUM-DEXTROSE 1-4 GM/50ML-% IV SOLN
INTRAVENOUS | Status: AC
  Filled 2020-04-02: qty 50

## 2020-04-02 MED ORDER — ESMOLOL HCL 100 MG/10ML IV SOLN
INTRAVENOUS | Status: DC | PRN
  Administered 2020-04-02 (×2): 10 mg via INTRAVENOUS

## 2020-04-02 MED ORDER — PROPOFOL 500 MG/50ML IV EMUL
INTRAVENOUS | Status: DC | PRN
  Administered 2020-04-02: 30 ug/kg/min via INTRAVENOUS

## 2020-04-02 MED ORDER — LORAZEPAM 2 MG/ML IJ SOLN: 1.0000 mg | INTRAMUSCULAR | Status: DC | PRN

## 2020-04-02 MED ORDER — LORAZEPAM 1 MG PO TABS: 1.0000 mg | ORAL_TABLET | ORAL | Status: DC | PRN

## 2020-04-02 MED ORDER — KETAMINE HCL 50 MG/5ML IJ SOSY
PREFILLED_SYRINGE | INTRAMUSCULAR | Status: AC
  Filled 2020-04-02: qty 5

## 2020-04-02 MED ORDER — MIDAZOLAM HCL 2 MG/2ML IJ SOLN
INTRAMUSCULAR | Status: AC
  Filled 2020-04-02: qty 2

## 2020-04-02 MED ORDER — ONDANSETRON HCL 4 MG/2ML IJ SOLN: 4.0000 mg | Freq: Four times a day (QID) | INTRAMUSCULAR | Status: DC | PRN

## 2020-04-02 MED ORDER — CHLORHEXIDINE GLUCONATE CLOTH 2 % EX PADS: 6.0000 | MEDICATED_PAD | Freq: Every day | CUTANEOUS | Status: DC

## 2020-04-02 MED ORDER — ASPIRIN EC 81 MG PO TBEC: 81.0000 mg | DELAYED_RELEASE_TABLET | Freq: Two times a day (BID) | ORAL | Status: DC

## 2020-04-02 MED ORDER — DOCUSATE SODIUM 100 MG PO CAPS: 100.0000 mg | ORAL_CAPSULE | Freq: Two times a day (BID) | ORAL | Status: DC

## 2020-04-02 MED ORDER — ESMOLOL HCL 100 MG/10ML IV SOLN
INTRAVENOUS | Status: AC
  Filled 2020-04-02: qty 10

## 2020-04-02 MED ORDER — SODIUM CHLORIDE 0.9 % IV SOLN
INTRAVENOUS | Status: DC | PRN
  Administered 2020-04-02: 40 ug/min via INTRAVENOUS

## 2020-04-02 SURGICAL SUPPLY — 56 items
BLADE SAGITTAL WIDE XTHICK NO (BLADE) ×2 IMPLANT
BRUSH SCRUB EZ  4% CHG (MISCELLANEOUS) ×1
BRUSH SCRUB EZ 4% CHG (MISCELLANEOUS) ×1 IMPLANT
CEMENT BONE 40GM (Cement) ×4 IMPLANT
CEMENT HV SMART SET (Cement) IMPLANT
CEMENT RESTRICTOR DEPUY SZ 6 (Cement) ×2 IMPLANT
CENTRALIZER STM 11XRCN HIP FEM (Knees) ×1 IMPLANT
CHLORAPREP W/TINT 26 (MISCELLANEOUS) ×2 IMPLANT
CNTRLZR STM 11XRECON HIP FEM (Knees) ×1 IMPLANT
COVER WAND RF STERILE (DRAPES) ×2 IMPLANT
DISTAL CENTRALIZER 11MM (Knees) ×1 IMPLANT
DRAPE 3/4 80X56 (DRAPES) ×2 IMPLANT
DRAPE C-ARM 42X72 X-RAY (DRAPES) ×2 IMPLANT
DRAPE STERI IOBAN 125X83 (DRAPES) IMPLANT
DRSG AQUACEL AG ADV 3.5X10 (GAUZE/BANDAGES/DRESSINGS) ×2 IMPLANT
DRSG AQUACEL AG ADV 3.5X14 (GAUZE/BANDAGES/DRESSINGS) IMPLANT
ELECT BLADE 6.5 EXT (BLADE) ×2 IMPLANT
ELECT REM PT RETURN 9FT ADLT (ELECTROSURGICAL) ×2
ELECTRODE REM PT RTRN 9FT ADLT (ELECTROSURGICAL) ×1 IMPLANT
FEM HEAD COCR 28MM 3 (Orthopedic Implant) ×2 IMPLANT
FEMORAL HEAD COCR 28MM 3 (Orthopedic Implant) ×1 IMPLANT
GAUZE XEROFORM 1X8 LF (GAUZE/BANDAGES/DRESSINGS) ×2 IMPLANT
GLOVE INDICATOR 8.0 STRL GRN (GLOVE) ×2 IMPLANT
GLOVE SURG ORTHO LTX SZ8 (GLOVE) ×4 IMPLANT
GOWN STRL REUS W/ TWL LRG LVL3 (GOWN DISPOSABLE) ×1 IMPLANT
GOWN STRL REUS W/ TWL XL LVL3 (GOWN DISPOSABLE) ×1 IMPLANT
GOWN STRL REUS W/TWL LRG LVL3 (GOWN DISPOSABLE) ×1
GOWN STRL REUS W/TWL XL LVL3 (GOWN DISPOSABLE) ×1
HOOD PEEL AWAY FLYTE STAYCOOL (MISCELLANEOUS) ×6 IMPLANT
IRRIGATION SURGIPHOR STRL (IV SOLUTION) ×2 IMPLANT
IV NS 1000ML (IV SOLUTION) ×1
IV NS 1000ML BAXH (IV SOLUTION) ×1 IMPLANT
KIT PATIENT CARE HANA TABLE (KITS) ×2 IMPLANT
KIT TURNOVER CYSTO (KITS) ×2 IMPLANT
LINER BIPOLAR 28X47MM (Liner) ×2 IMPLANT
MANIFOLD NEPTUNE II (INSTRUMENTS) ×2 IMPLANT
MAT ABSORB  FLUID 56X50 GRAY (MISCELLANEOUS) ×1
MAT ABSORB FLUID 56X50 GRAY (MISCELLANEOUS) ×1 IMPLANT
NDL SAFETY ECLIPSE 18X1.5 (NEEDLE) ×2 IMPLANT
NEEDLE HYPO 18GX1.5 SHARP (NEEDLE) ×2
NEEDLE HYPO 22GX1.5 SAFETY (NEEDLE) ×2 IMPLANT
NEEDLE SPNL 20GX3.5 QUINCKE YW (NEEDLE) ×2 IMPLANT
PACK HIP PROSTHESIS (MISCELLANEOUS) ×2 IMPLANT
PADDING CAST BLEND 4X4 NS (MISCELLANEOUS) ×4 IMPLANT
PILLOW ABDUCTION MEDIUM (MISCELLANEOUS) ×2 IMPLANT
PULSAVAC PLUS IRRIG FAN TIP (DISPOSABLE) ×2
STAPLER SKIN PROX 35W (STAPLE) ×2 IMPLANT
STEM FEMORAL SZ12 SYNERGY (Stem) ×2 IMPLANT
SUT BONE WAX W31G (SUTURE) ×2 IMPLANT
SUT DVC 2 QUILL PDO  T11 36X36 (SUTURE) ×1
SUT DVC 2 QUILL PDO T11 36X36 (SUTURE) ×1 IMPLANT
SUT VIC AB 2-0 CT1 18 (SUTURE) ×2 IMPLANT
SYR 20ML LL LF (SYRINGE) ×2 IMPLANT
TIP BRUSH PULSAVAC PLUS 24.33 (MISCELLANEOUS) ×2 IMPLANT
TIP FAN IRRIG PULSAVAC PLUS (DISPOSABLE) ×1 IMPLANT
TOWER CARTRIDGE SMART MIX (DISPOSABLE) ×2 IMPLANT

## 2020-04-03 ENCOUNTER — Encounter: Payer: Self-pay | Admitting: Orthopedic Surgery

## 2020-04-03 LAB — TROPONIN I (HIGH SENSITIVITY): Troponin I (High Sensitivity): 103 ng/L (ref <18)

## 2020-04-06 LAB — SURGICAL PATHOLOGY

## 2020-04-10 MED FILL — Medication: Qty: 1 | Status: AC

## 2020-05-01 NOTE — Progress Notes (Addendum)
    BRIEF OVERNIGHT PROGRESS REPORT   ADDENDUM: Code blue initiated at 23:32. Patient became unresponsive and pulseless. CPR initiated with immediate ROCS following administration of 1 amp of epinephrine. During the code process, patient's daughter Burna Mortimer was contacted and she decided to call off further attempts at resuscitation. Patient was subsequently transitioned to comfort care per daughter's request and passed away peacefully at 23:51. Patient pronounced dead on 04/30/2020 at 23:51. Family at the bedside.   SUBJECTIVE: Notified by Anesthesiologist that patient continues to require increased oxygenation post -op now on HFNC with associated hypotension and tachycardia in the upper 140s.  OBJECTIVE: On arrival to the bedside, she was afebrile with blood pressure  74/64 mm Hg, RR 29 and pulse rate 127 beats/min. There were no focal neurological deficits; she was lethargic and unresponsive to verbal stimuli.   ASSESSMENT & PLAN:  Acute Hypoxic Respiratory Failure secondary to Post-Op Atelectasis versus pneumonia/Aspiration? S/p Right Hip Anterior Hip Hemiarthroplasty POD # 0 Hx: Pulmonary Embolism and Sleep Apnea  -Transfer to stepdown for overnight monitoring -Supplemental O2 as needed to maintain O2 saturations 88 to 92% -HFNC, wean as tolerated -High risk for intubation -Follow intermittent ABG and chest x-ray as needed -Repeat CXR with Small bilateral pleural effusions with left greater than right basilar airspace disease, atelectasis versus pneumonia versus aspiration -Will obtain CTA Chest  Hypotension -Start low dose Neo to keep MAP>65 mm Hg -Check labs, procal, CBC, BMP, Mag   -Acute Metabolic Encephalopathy - multifactorial post op delirium, hypoxia in a patient with hx of dementia. -Provide supportive care -Follow up CT head   Acute Kidney Injury on CKD stage III -Monitor I&O's / urinary output -Follow BMP -Ensure adequate renal perfusion -Avoid nephrotoxic agents as  able -Replace electrolytes as indicated      Webb Silversmith, DNP, CCRN, FNP-C [facility] Nurse Practitioner Between 7pm to 7am - Pager [phone]  After 7am go to [email or web address] - [email or web address]  [employer]  [phone]

## 2020-05-01 NOTE — Progress Notes (Addendum)
Patient was found drowsy and foaming at the mouth when lab was drawing blood.  Suctioned patient with some response and able to squeeze hand.  Eventually patient status became more unresponsive and RT/CN/MD informed of possible aspiration.

## 2020-05-01 NOTE — Transfer of Care (Signed)
Immediate Anesthesia Transfer of Care Note  Patient: Debra Orr  Procedure(s) Performed: ARTHROPLASTY BIPOLAR HIP (HEMIARTHROPLASTY) (Right Hip)  Patient Location: PACU  Anesthesia Type:Spinal  Level of Consciousness: drowsy  Airway & Oxygen Therapy: Patient Spontanous Breathing and Patient connected to face mask oxygen  Post-op Assessment: Report given to RN and Post -op Vital signs reviewed and stable  Post vital signs: Reviewed and stable  Last Vitals:  Vitals Value Taken Time  BP 110/69   Temp    Pulse 99   Resp 20   SpO2 89     Last Pain:  Vitals:   04/17/20 1513  TempSrc: Temporal  PainSc:          Complications: No complications documented.

## 2020-05-01 NOTE — Progress Notes (Signed)
Patient to CT.

## 2020-05-01 NOTE — Progress Notes (Signed)
Patient straigh to ICU after CT scan

## 2020-05-01 NOTE — Progress Notes (Signed)
Patient into pacu, sats 88 to 91 on 10 liters Oxygen face mask, Dr Randa Ngo at [employer]

## 2020-05-01 NOTE — Anesthesia Procedure Notes (Signed)
Date/Time: 2020-05-01 4:08 PM Performed by: Rosaria Ferries, MD Pre-anesthesia Checklist: Patient identified, Emergency Drugs available, Suction available, Patient being monitored and Timeout performed Patient Re-evaluated:Patient Re-evaluated prior to induction Oxygen Delivery Method: Simple face mask Induction Type: IV induction

## 2020-05-01 NOTE — Anesthesia Procedure Notes (Signed)
Spinal  Patient location during procedure: OR Start time: 2020-04-13 4:11 PM End time: 04-13-2020 4:21 PM Staffing Performed: anesthesiologist  Anesthesiologist: Alisyn Lequire, Precious Haws, MD Resident/CRNA: Norm Salt, CRNA Preanesthetic Checklist Completed: patient identified, IV checked, site marked, risks and benefits discussed, surgical consent, monitors and equipment checked and pre-op evaluation Spinal Block Patient position: sitting Prep: ChloraPrep Patient monitoring: heart rate, continuous pulse ox and blood pressure Approach: midline Location: L3-4 Injection technique: single-shot Needle Needle type: Whitacre and Introducer  Needle gauge: 24 G Needle length: 9 cm Additional Notes Negative paresthesia. Negative blood return. Positive free-flowing CSF. Expiration date of kit checked and confirmed. Patient tolerated procedure well, without complications.

## 2020-05-01 NOTE — Anesthesia Preprocedure Evaluation (Addendum)
Anesthesia Evaluation  Patient identified by MRN, date of birth, ID band Patient confused    Reviewed: Allergy & Precautions, H&P , NPO status , Patient's Chart, lab work & pertinent test results  History of Anesthesia Complications (+) POST - OP SPINAL HEADACHE and history of anesthetic complications  Airway Mallampati: III  TM Distance: >3 FB Neck ROM: full    Dental  (+) Edentulous Upper, Edentulous Lower   Pulmonary neg shortness of breath, sleep apnea ,    Pulmonary exam normal        Cardiovascular hypertension, Normal cardiovascular exam     Neuro/Psych PSYCHIATRIC DISORDERS Dementia negative neurological ROS     GI/Hepatic negative GI ROS, Neg liver ROS,   Endo/Other  negative endocrine ROS  Renal/GU Renal disease     Musculoskeletal  (+) Arthritis ,   Abdominal   Peds  Hematology negative hematology ROS (+)   Anesthesia Other Findings   BMI    Body Mass Index: 23.25 kg/m      Reproductive/Obstetrics negative OB ROS                            Anesthesia Physical Anesthesia Plan  ASA: III  Anesthesia Plan: Spinal   Post-op Pain Management:    Induction:   PONV Risk Score and Plan:   Airway Management Planned: Natural Airway and Nasal Cannula  Additional Equipment:   Intra-op Plan:   Post-operative Plan:   Informed Consent: I have reviewed the patients History and Physical, chart, labs and discussed the procedure including the risks, benefits and alternatives for the proposed anesthesia with the patient or authorized representative who has indicated his/her understanding and acceptance.     Dental Advisory Given  Plan Discussed with: Anesthesiologist, CRNA and Surgeon  Anesthesia Plan Comments: (History and consent from the patients daughter  Daughter and medical record report no bleeding problems and no active anticoagulant use(has been off Xarelto for  greater than 72 hours).   Plan for spinal with backup GA  Daughter consented for risks of anesthesia including but not limited to:  - adverse reactions to medications - damage to eyes, teeth, lips or other oral mucosa - nerve damage due to positioning  - risk of bleeding, infection and or nerve damage from spinal that could lead to paralysis - risk of headache or failed spinal - damage to teeth, lips or other oral mucosa - sore throat or hoarseness - damage to heart, brain, nerves, lungs, other parts of body or loss of life  She voiced understanding.)       Anesthesia Quick Evaluation

## 2020-05-01 NOTE — Death Summary Note (Signed)
DEATH SUMMARY   Patient Details  Name: MISK Orr MRN: [id number] DOB: 05-25-1928  Admission/Discharge Information   Admit Date:  2020-04-15  Date of Death: Date of Death: 18-Apr-2020  Time of Death: Time of Death: 06-03-2349  Length of Stay: 3  Referring Physician: Gracelyn Nurse, MD   Reason(s) for Hospitalization  Difficulty walking  Diagnoses  Preliminary cause of death: Pneumonia Secondary Diagnoses (including complications and co-morbidities):  Principal Problem:   Closed subcapital fracture of femur, right, initial encounter [facility]) Active Problems:   Hyperlipidemia   Hypertension   Late onset Alzheimer's disease without behavioral disturbance (HCC)   Mitral valve prolapse   OSA (obstructive sleep apnea)   Pulmonary embolism (HCC)   Acute renal failure superimposed on stage 3 chronic kidney disease (HCC)   Protein-calorie malnutrition, severe   Brief Hospital Course (including significant findings, care, treatment, and services provided and events leading to death)  Debra Orr is a 87 y.o. year old female with medical history significant ofdementia, anxiety, cataracts, CKD 3, diverticulosis, hemorrhoids, hyperlipidemia, hypertension, PE on Xarelto, mitral valve prolapse, sleep apnea, venous insufficiency, who presented to the hospital because of difficulty walking for about a week.    She was found to have a right-sided subcapital femoral fracture.  She was treated with analgesics on.  She was seen in consultation by the orthopedic surgeon.  She underwent right hip anterior hemiarthroplasty.  Reportedly, patient became unresponsive, hypoxic and pulseless.  She was found to be in PEA cardiac arrest.  CPR was performed but this was subsequently discontinued at the request of her daughter who elected not to proceed with intubation and mechanical ventilation.  Exact cause of cardiac arrest and acute hypoxic respiratory failure is not clear.  Differential diagnoses include  pneumonia.   Pertinent Labs and Studies  Significant Diagnostic Studies CT HEAD WO CONTRAST  Result Date: 04-18-20 CLINICAL DATA:  Initial evaluation for acute neuro deficit, stroke suspected. Altered mental status. EXAM: CT HEAD WITHOUT CONTRAST TECHNIQUE: Contiguous axial images were obtained from the base of the skull through the vertex without intravenous contrast. COMPARISON:  Prior CT from 09/25/2013. FINDINGS: Brain: Generalized age-related cerebral atrophy, with asymmetric left cerebellar atrophy. Prior left retrosigmoid craniotomy. Associated moderate chronic microvascular ischemic disease. Few scatter remote bilateral cerebellar infarcts noted. Remote lacunar infarcts at the right basal ganglia. No acute intracranial hemorrhage. No acute large vessel territory infarct. No mass lesion, midline shift or mass effect. No hydrocephalus or extra-axial fluid collection. Vascular: No hyperdense vessel. Calcified atherosclerosis at the skull base. Skull: Scalp soft tissues demonstrate no acute finding. Prior left retrosigmoid craniotomy. Calvarium otherwise intact. Sinuses/Orbits: Globes orbital soft tissues demonstrate no acute finding. Paranasal sinuses and mastoid air cells are clear. Other: None. IMPRESSION: 1. No acute intracranial abnormality. 2. Prior left retrosigmoid craniotomy. 3. Generalized age-related cerebral atrophy with moderate chronic microvascular ischemic disease. Electronically Signed   By: Rise Mu M.D.   On: 04/18/20 21:24   CT ANGIO CHEST PE W OR WO CONTRAST  Result Date: Apr 18, 2020 CLINICAL DATA:  Altered level of consciousness, postop hip surgery, hypoxia EXAM: CT ANGIOGRAPHY CHEST WITH CONTRAST TECHNIQUE: Multidetector CT imaging of the chest was performed using the standard protocol during bolus administration of intravenous contrast. Multiplanar CT image reconstructions and MIPs were obtained to evaluate the vascular anatomy. CONTRAST:  68mL OMNIPAQUE IOHEXOL  350 MG/ML SOLN COMPARISON:  2020/04/18 FINDINGS: Cardiovascular: This is a technically adequate evaluation of the pulmonary vasculature. No filling defects or pulmonary emboli. The  heart is unremarkable without pericardial effusion. Normal caliber of the thoracic aorta, with no evidence of dissection. Diffuse atherosclerosis of the aortic arch and descending thoracic aorta. Mediastinum/Nodes: No enlarged mediastinal, hilar, or axillary lymph nodes. Thyroid gland, trachea, and esophagus demonstrate no significant findings. Lungs/Pleura: There are small bilateral pleural effusions, with areas of bibasilar atelectasis. No airspace disease or pneumothorax. The central airways are patent. Upper Abdomen: Calcified gallstones are partially visualized. No evidence of acute cholecystitis. There is bilateral renal cortical atrophy, with left renal cyst again noted. Musculoskeletal: No acute or destructive bony lesions. Previous vertebral augmentations within the lower thoracic spine. Reconstructed images demonstrate no additional findings. Review of the MIP images confirms the above findings. IMPRESSION: 1. No evidence of pulmonary embolus. 2. Small bilateral pleural effusions with bibasilar atelectasis. 3. Cholelithiasis without cholecystitis. 4.  Aortic Atherosclerosis (ICD10-I70.0). Electronically Signed   By: Sharlet Salina M.D.   On: 04/14/2020 21:14   DG Chest Port 1 View  Result Date: 04/28/2020 CLINICAL DATA:  Hypoxia EXAM: PORTABLE CHEST 1 VIEW COMPARISON:  Apr 21, 2020, 02/28/2005 FINDINGS: Small bilateral pleural effusions with left greater than right basilar airspace disease. Stable cardiomediastinal silhouette with aortic atherosclerosis. No pneumothorax IMPRESSION: Small bilateral pleural effusions with left greater than right basilar airspace disease, atelectasis versus pneumonia versus aspiration Electronically Signed   By: Jasmine Pang M.D.   On: 04/24/2020 19:15   Chest Portable 1 View  Result Date:  04/21/2020 CLINICAL DATA:  left-sided hip pain. EXAM: PORTABLE CHEST 1 VIEW COMPARISON:  January 30, 2010 FINDINGS: The heart size and mediastinal contours are mildly enlarged. Aortic knob calcifications are seen. There is elevation of the right hemidiaphragm. Increased interstitial markings seen at both lung apices bases, likely chronic lung changes. No large airspace consolidation or pleural effusion. No acute osseous abnormality. IMPRESSION: No active disease. Electronically Signed   By: Jonna Clark M.D.   On: 04-21-20 20:12   DG HIP OPERATIVE UNILAT W OR W/O PELVIS RIGHT  Result Date: 04/09/2020 CLINICAL DATA:  Hip arthroplasty EXAM: OPERATIVE right HIP (WITH PELVIS IF PERFORMED)  VIEWS TECHNIQUE: Fluoroscopic spot image(s) were submitted for interpretation post-operatively. COMPARISON:  None. FINDINGS: One intraop image was submitted for review of a right hemiarthroplasty. Fluoro time 14 seconds IMPRESSION: Intraop view of a right hemiarthroplasty.  Fluoro time 14 seconds Electronically Signed   By: Jonna Clark M.D.   On: 04/24/2020 20:09   DG Hip Unilat With Pelvis 2-3 Views Left  Result Date: 04/21/2020 CLINICAL DATA:  Left-sided hip pain EXAM: DG HIP (WITH OR WITHOUT PELVIS) 2-3V LEFT COMPARISON:  None. FINDINGS: Frontal view of the pelvis as well as frontal and frogleg lateral views of the left hip are obtained. The bones are diffusely osteopenic. There is evidence of a subcapital right femoral neck fracture. Please correlate with any history of prior trauma for current symptoms. Dedicated views of the right hip may be useful. The left hip is well aligned with no acute bony abnormality. There is moderate bilateral hip osteoarthritis. Significant lower lumbar spondylosis. A large amount of stool within the rectal vault. IMPRESSION: 1. Evidence of subcapital right femoral neck fracture as above. Dedicated right hip views may be useful. 2. No evidence of left hip fracture.  Severe osteopenia. 3.  Bilateral hip osteoarthritis. Electronically Signed   By: Sharlet Salina M.D.   On: 04/21/20 19:00    Microbiology Recent Results (from the past 240 hour(s))  SARS CORONAVIRUS 2 (TAT 6-24 HRS) Nasopharyngeal Nasopharyngeal Swab  Status: None   Collection Time: 03/16/2020  7:21 PM   Specimen: Nasopharyngeal Swab  Result Value Ref Range Status   SARS Coronavirus 2 NEGATIVE NEGATIVE Final    Comment: (NOTE) SARS-CoV-2 target nucleic acids are NOT DETECTED.  The SARS-CoV-2 RNA is generally detectable in upper and lower respiratory specimens during the acute phase of infection. Negative results do not preclude SARS-CoV-2 infection, do not rule out co-infections with other pathogens, and should not be used as the sole basis for treatment or other patient management decisions. Negative results must be combined with clinical observations, patient history, and epidemiological information. The expected result is Negative.  Fact Sheet for Patients: HairSlick.no  Fact Sheet for Healthcare Providers: quierodirigir.com  This test is not yet approved or cleared by the [place] FDA and  has been authorized for detection and/or diagnosis of SARS-CoV-2 by FDA under an Emergency Use Authorization (EUA). This EUA will remain  in effect (meaning this test can be used) for the duration of the COVID-19 declaration under Se ction 564(b)(1) of the Act, 21 U.S.C. section 360bbb-3(b)(1), unless the authorization is terminated or revoked sooner.  Performed at [facility], [address] [place], [place] [address]   Surgical PCR screen     Status: None   Collection Time: 03/31/20  2:26 PM   Specimen: Nasal Mucosa; Nasal Swab  Result Value Ref Range Status   MRSA, PCR NEGATIVE NEGATIVE Final   Staphylococcus aureus NEGATIVE NEGATIVE Final    Comment: (NOTE) The Xpert SA Assay (FDA approved for NASAL specimens in patients 22 years  of age and older), is one component of a comprehensive surveillance program. It is not intended to diagnose infection nor to guide or monitor treatment. Performed at [facility], [address] [place], [place] [address]     Lab Basic Metabolic Panel: Recent Labs  Lab 03/18/2020 1903 03/31/20 7616 04/01/20 0502 2020-04-09 0644 April 09, 2020 2011  NA 139 144 144 144 144  K 4.7 4.5 4.2 4.4 4.6  CL 109 114* 114* 115* 114*  CO2 21* 21* 24 24 20*  GLUCOSE 156* 127* 96 122* 162*  BUN 39* 42* 47* 38* 34*  CREATININE 1.81* 1.90* 1.67* 1.56* 1.48*  CALCIUM 9.3 8.8* 8.6* 8.5* 8.7*  MG  --   --   --   --  1.7   Liver Function Tests: Recent Labs  Lab 03/10/2020 1903  AST 35  ALT 12  ALKPHOS 89  BILITOT 1.6*  PROT 6.7  ALBUMIN 3.0*   No results for input(s): LIPASE, AMYLASE in the last 168 hours. No results for input(s): AMMONIA in the last 168 hours. CBC: Recent Labs  Lab 03/26/2020 1903 03/31/20 0623 04/01/20 0502 04-09-2020 0644 April 09, 2020 2011  WBC 10.8* 10.9* 11.7* 12.5* 21.0*  NEUTROABS  --   --  8.6* 10.3*  --   HGB 13.0 11.9* 11.3* 11.3* 11.9*  HCT 40.5 35.4* 35.2* 34.2* 37.0  MCV 98.5 98.6 101.4* 99.4 101.1*  PLT 269 204 191 206 240   Cardiac Enzymes: No results for input(s): CKTOTAL, CKMB, CKMBINDEX, TROPONINI in the last 168 hours. Sepsis Labs: Recent Labs  Lab 03/31/20 0623 04/01/20 0502 Apr 09, 2020 0644 2020-04-09 2011  PROCALCITON  --   --   --  0.14  WBC 10.9* 11.7* 12.5* 21.0*    Procedures/Operations  Right hip anterior hemiarthroplasty on 04/09/2020   Aariyana Manz 04/03/2020, 5:23 PM

## 2020-05-01 NOTE — Significant Event (Signed)
Notified by ICU Charge Nurse pt had a significant change in mentation unable to follow commands and became severely hypoxic O2 sats 40's.  Hospitalist NP Webb Silversmith contacted pts daughter to determine if she wanted to proceed with mechanical intubation.  However, upon my arrival at bedside pt hypoxic with O2 sats in the 40's and hypotensive requiring neo-synephrine gtt.  Pt subsequently PEA arrested requiring initiation of ACLS protocol and 1 amp of epinephrine. Pts daughter decided not to proceed with mechanical intubation or continued ACLS protocol, therefore CPR stopped after 3 minutes per pts daughters request.  However, during pulse check pt had achieved ROSC.  Pts daughter decided to change pts code status to DNR/DNI and did not want to proceed with mechanical intubation. Pt actively dying and pts family are on the way to the hospital.  Hospitalist to remain the primary.    Sonda Rumble, AGNP  Pulmonary/Critical Care Pager [phone] (please enter 7 digits) PCCM Consult Pager [phone] (please enter 7 digits)

## 2020-05-01 NOTE — Progress Notes (Signed)
Progress Note    Debra Orr  NOB:[id number] DOB: [birth year]  DOA: 03/23/2020 PCP: Gracelyn Nurse, MD      Brief Narrative:    Medical records reviewed and are as summarized below:  Debra Orr is a 87 y.o. female with medical history significant ofdementia, anxiety, cataracts, CKD 3, diverticulosis, hemorrhoids, hyperlipidemia, hypertension, PE on Xarelto, mitral valve prolapse, sleep apnea, venous insufficiency, presented to the hospital because of difficulty walking for about a week.      Assessment/Plan:   Principal Problem:   Closed subcapital fracture of femur, right, initial encounter (HCC) Active Problems:   Hyperlipidemia   Hypertension   Late onset Alzheimer's disease without behavioral disturbance (HCC)   Mitral valve prolapse   OSA (obstructive sleep apnea)   Pulmonary embolism (HCC)   Acute renal failure superimposed on stage 3 chronic kidney disease (HCC)   Protein-calorie malnutrition, severe   Nutrition Problem: Severe Malnutrition Etiology: chronic illness (dementia, CKD)  Signs/Symptoms: severe fat depletion,severe muscle depletion   Body mass index is 23.25 kg/m.     Right-sided subcapital femur fracture: Continue analgesics as needed.  Plan for right hip surgery today.  Follow-up with orthopedic surgeon.    AKI on CKD stage IIIb: Creatinine is trending down.  Monitor BMP  Hypertension, history of arrhythmia: continue metoprolol  Dementia: Continue donepezil and Namenda.  History of pulmonary embolism: Xarelto on hold because of upcoming surgery.  Left eye irritation/infection: Continue ciprofloxacin eyedrops  Other comorbidities include hyperlipidemia, gout, anxiety, sleep dependence, OSA (nonadherent to CPAP)  Diet Order            Diet NPO time specified  Diet effective midnight                    Consultants:  Orthopedic surgeon  Procedures:  None    Medications:   . [MAR Hold] allopurinol  100 mg  Oral Daily  . [MAR Hold] ciprofloxacin  1 drop Left Eye TID  . [MAR Hold] donepezil  10 mg Oral Daily  . [MAR Hold] feeding supplement  237 mL Oral TID BM  . [MAR Hold] memantine  10 mg Oral BID  . [MAR Hold] metoprolol tartrate  25 mg Oral BID  . [MAR Hold] multivitamin with minerals  1 tablet Oral Daily  . [MAR Hold] simvastatin  40 mg Oral q1800  . [MAR Hold] tranexamic acid (CYKLOKAPRON) topical - INTRAOP  2,000 mg Topical On Call to OR   Continuous Infusions: . ceFAZolin    . [MAR Hold]  ceFAZolin (ANCEF) IV       Anti-infectives (From admission, onward)   Start     Dose/Rate Route Frequency Ordered Stop   04/09/2020 1532  ceFAZolin (ANCEF) 1-4 GM/50ML-% IVPB       Note to Pharmacy: Malva Limes   : cabinet override      04/05/2020 1532 04/03/20 0344   04/29/2020 0000  [MAR Hold]  ceFAZolin (ANCEF) IVPB 1 g/50 mL premix        (MAR Hold since Thu 04/22/2020 at 1513.Hold Reason: Transfer to a Procedural area.)   1 g 100 mL/hr over 30 Minutes Intravenous 30 min pre-op 04/01/20 1857               Family Communication/Anticipated D/C date and plan/Code Status   DVT prophylaxis: SCDs Start: 03/04/2020 1944     Code Status: Full Code  Family Communication: Plan discussed with her daughter, Burna Mortimer Disposition Plan:  Status is: Inpatient  Remains inpatient appropriate because:Unsafe d/c plan   Dispo: The patient is from: Home              Anticipated d/c is to: SNF              Patient currently is not medically stable to d/c.   Difficult to place patient No           Subjective:   Interval events noted.  No new complaints.  She still has pain in the right hip.  Her daughter, Burna Mortimer, was at the bedside.  Objective:    Vitals:   04/17/2020 0434 04/09/2020 0747 04/20/2020 1513 04/13/2020 1517  BP: (!) 133/52 135/66 (!) 167/115   Pulse: 70 82    Resp: 20 18 18    Temp: 98.5 F (36.9 C) 98.1 F (36.7 C) (!) 97.5 F (36.4 C)   TempSrc: Oral  Temporal   SpO2:  92% 93% 96%   Weight:    54 kg  Height:    5' (1.524 m)   No data found.   Intake/Output Summary (Last 24 hours) at 04/15/2020 1605 Last data filed at 04/06/2020 0450 Gross per 24 hour  Intake -  Output 150 ml  Net -150 ml   Filed Weights   04/20/2020 1836 04/07/2020 1517  Weight: 54.4 kg 54 kg    Exam:  GEN: NAD SKIN: No rash EYES: EOMI ENT: MMM CV: RRR PULM: CTA B ABD: soft, ND, NT, +BS CNS: AAO x 2 (person and place), non focal EXT: No edema or tenderness         Data Reviewed:   I have personally reviewed following labs and imaging studies:  Labs: Labs show the following:   Basic Metabolic Panel: Recent Labs  Lab 20-Apr-2020 1903 03/31/20 0623 04/01/20 0502 04/09/2020 0644  NA 139 144 144 144  K 4.7 4.5 4.2 4.4  CL 109 114* 114* 115*  CO2 21* 21* 24 24  GLUCOSE 156* 127* 96 122*  BUN 39* 42* 47* 38*  CREATININE 1.81* 1.90* 1.67* 1.56*  CALCIUM 9.3 8.8* 8.6* 8.5*   GFR Estimated Creatinine Clearance: 17.6 mL/min (A) (by C-G formula based on SCr of 1.56 mg/dL (H)). Liver Function Tests: Recent Labs  Lab Apr 20, 2020 1903  AST 35  ALT 12  ALKPHOS 89  BILITOT 1.6*  PROT 6.7  ALBUMIN 3.0*   No results for input(s): LIPASE, AMYLASE in the last 168 hours. No results for input(s): AMMONIA in the last 168 hours. Coagulation profile Recent Labs  Lab 2020-04-20 1903 04/01/20 0502 04/29/2020 0644  INR 3.5* 1.5* 1.4*    CBC: Recent Labs  Lab 04/20/20 1903 03/31/20 0623 04/01/20 0502 04/07/2020 0644  WBC 10.8* 10.9* 11.7* 12.5*  NEUTROABS  --   --  8.6* 10.3*  HGB 13.0 11.9* 11.3* 11.3*  HCT 40.5 35.4* 35.2* 34.2*  MCV 98.5 98.6 101.4* 99.4  PLT 269 204 191 206   Cardiac Enzymes: No results for input(s): CKTOTAL, CKMB, CKMBINDEX, TROPONINI in the last 168 hours. BNP (last 3 results) No results for input(s): PROBNP in the last 8760 hours. CBG: No results for input(s): GLUCAP in the last 168 hours. D-Dimer: No results for input(s): DDIMER in the  last 72 hours. Hgb A1c: No results for input(s): HGBA1C in the last 72 hours. Lipid Profile: No results for input(s): CHOL, HDL, LDLCALC, TRIG, CHOLHDL, LDLDIRECT in the last 72 hours. Thyroid function studies: No results for input(s): TSH, T4TOTAL, T3FREE, THYROIDAB in  the last 72 hours.  Invalid input(s): FREET3 Anemia work up: No results for input(s): VITAMINB12, FOLATE, FERRITIN, TIBC, IRON, RETICCTPCT in the last 72 hours. Sepsis Labs: Recent Labs  Lab 04/18/20 1903 03/31/20 0623 04/01/20 0502 04/23/2020 0644  WBC 10.8* 10.9* 11.7* 12.5*    Microbiology Recent Results (from the past 240 hour(s))  SARS CORONAVIRUS 2 (TAT 6-24 HRS) Nasopharyngeal Nasopharyngeal Swab     Status: None   Collection Time: 04-18-2020  7:21 PM   Specimen: Nasopharyngeal Swab  Result Value Ref Range Status   SARS Coronavirus 2 NEGATIVE NEGATIVE Final    Comment: (NOTE) SARS-CoV-2 target nucleic acids are NOT DETECTED.  The SARS-CoV-2 RNA is generally detectable in upper and lower respiratory specimens during the acute phase of infection. Negative results do not preclude SARS-CoV-2 infection, do not rule out co-infections with other pathogens, and should not be used as the sole basis for treatment or other patient management decisions. Negative results must be combined with clinical observations, patient history, and epidemiological information. The expected result is Negative.  Fact Sheet for Patients: HairSlick.no  Fact Sheet for Healthcare Providers: quierodirigir.com  This test is not yet approved or cleared by the [place] FDA and  has been authorized for detection and/or diagnosis of SARS-CoV-2 by FDA under an Emergency Use Authorization (EUA). This EUA will remain  in effect (meaning this test can be used) for the duration of the COVID-19 declaration under Se ction 564(b)(1) of the Act, 21 U.S.C. section 360bbb-3(b)(1), unless  the authorization is terminated or revoked sooner.  Performed at [facility], [address], [place], [place] [address]   Surgical PCR screen     Status: None   Collection Time: 03/31/20  2:26 PM   Specimen: Nasal Mucosa; Nasal Swab  Result Value Ref Range Status   MRSA, PCR NEGATIVE NEGATIVE Final   Staphylococcus aureus NEGATIVE NEGATIVE Final    Comment: (NOTE) The Xpert SA Assay (FDA approved for NASAL specimens in patients 21 years of age and older), is one component of a comprehensive surveillance program. It is not intended to diagnose infection nor to guide or monitor treatment. Performed at [facility], [address] [place], [place] [address]     Procedures and diagnostic studies:  No results found.             LOS: 3 days   BERNARD AYIKU  [facility]   Pager on [email or web address]. If 7PM-7AM, please contact night-coverage at [email or web address]     04/05/2020, 4:05 PM

## 2020-05-01 NOTE — Anesthesia Postprocedure Evaluation (Signed)
Anesthesia Post Note  Patient: Debra Orr  Procedure(s) Performed: ARTHROPLASTY BIPOLAR HIP (HEMIARTHROPLASTY) (Right Hip)  Patient location during evaluation: PACU Anesthesia Type: Spinal Level of consciousness: confused (baseline PreOp confusion) Pain management: pain level controlled Vital Signs Assessment: vitals unstable Respiratory status: spontaneous breathing, nonlabored ventilation, respiratory function stable and patient connected to face mask oxygen Cardiovascular status: blood pressure returned to baseline and stable Postop Assessment: no apparent nausea or vomiting Comments: Patient being sent for CTA then to StepDown   No complications documented.   Last Vitals:  Vitals:   04/01/2020 1946 04/16/2020 2000  BP: (!) 85/62   Pulse:  (!) 122  Resp: (!) 21 19  Temp:    SpO2:  (!) 89%    Last Pain:  Vitals:   04/10/2020 2000  TempSrc:   PainSc: 0-No pain                 Cleda Mccreedy Debra Orr

## 2020-05-01 NOTE — Op Note (Signed)
April 28, 2020 - 04/05/2020  6:30 PM  PATIENT:  Debra Orr   MRN: [id number]  PRE-OPERATIVE DIAGNOSIS:  Displaced Subcapital fracture right hip   POST-OPERATIVE DIAGNOSIS: Same  Procedure: Right Hip Anterior Hip Hemiarthroplasty   Surgeon: Dola Argyle. Odis Luster, MD   Assist: Altamese Cabal, PA-C  Anesthesia: Spinal   EBL: 100 mL   Specimens: None   Drains: None   Components used: A size 12 Summit cemented stem, a 47 mm bipolar head    Description of the procedure in detail: After informed consent was obtained and the appropriate extremity marked in the pre-operative holding area, the patient was taken to the operating room and placed in the supine position on the fracture table. All pressure points were well padded and bilateral lower extremities were place in traction spars. The hip was prepped and draped in standard sterile fashion. A spinal anesthetic had been delivered by the anesthesia team. The skin and subcutaneous tissues were injected with a mixture of Marcaine with epinephrine for post-operative pain. A longitudinal incision approximately 10 cm in length was carried out from the anterior superior iliac spine to the greater trochanter. The tensor fascia was divided and blunt dissection was taken down to the level of the joint capsule. The lateral circumflex vessels were cauterized. Deep retractors were placed and a portion of the anterior capsule was excised. Using fluoroscopy the neck cut was planned and carried out with a sagittal saw. The head was passed from the field with use of a corkscrew and hip skid. Deep retractors were placed along the acetabulum and bony and soft tissue debris was removed.   Attention was then turned to the proximal femur. The leg was placed in extension and external rotation.  The bone quality was extremely poor and there was significant femoral bowing. A decision was made to proceed with a short cemented stem. The canal was opened and sequentially broached  to a size 12. The trial components were placed and the hip relocated. The components were found to be in good position using fluoroscopy. The hip was dislocated and the trial components removed. The final components were cemented in to position and the hip relocated. The final components were again check with fluoroscopy and found to be in good position. Hemostasis was achieved with electrocautery. The deep capsule was injected with Marcaine and epinephrine. The wound was irrigated with bacitracin laced normal saline and the tensor fascia closed with #2 Quill suture. The subcutaneous tissues were closed with 2-0 vicryl and staples for the skin. A sterile dressing was applied and an abduction pillow. Patient tolerated the procedure well and there were no apparent complication. Patient was taken to the recovery room in good condition.    Dola Argyle. Odis Luster, MD  04/30/2020 6:30 PM

## 2020-05-01 NOTE — Progress Notes (Incomplete Revision)
    BRIEF OVERNIGHT PROGRESS REPORT   ADDENDUM: Code blue initiated at 23:32. Patient became unresponsive and pulseless. CPR initiated with immediate ROCS without administration of ACLS drugs. During the code process, patient's daughter Burna Mortimer was contacted        SUBJECTIVE: Notified by Anesthesiologist that patient continues to require increased oxygenation post -op now on HFNC with associated hypotension and tachycardia in the upper 140s.  OBJECTIVE: On arrival to the bedside, she was afebrile with blood pressure  74/64 mm Hg, RR 29 and pulse rate 127 beats/min. There were no focal neurological deficits; she was lethargic and unresponsive to verbal stimuli.   ASSESSMENT & PLAN:  Acute Hypoxic Respiratory Failure secondary to Post-Op Atelectasis versus pneumonia/Aspiration? S/p Right Hip Anterior Hip Hemiarthroplasty POD # 0 Hx: Pulmonary Embolism and Sleep Apnea  -Transfer to stepdown for overnight monitoring -Supplemental O2 as needed to maintain O2 saturations 88 to 92% -HFNC, wean as tolerated -High risk for intubation -Follow intermittent ABG and chest x-ray as needed -Repeat CXR with Small bilateral pleural effusions with left greater than right basilar airspace disease, atelectasis versus pneumonia versus aspiration -Will obtain CTA Chest  Hypotension -Start low dose Neo to keep MAP>65 mm Hg -Check labs, procal, CBC, BMP, Mag   -Acute Metabolic Encephalopathy - multifactorial post op delirium, hypoxia in a patient with hx of dementia. -Provide supportive care -Follow up CT head   Acute Kidney Injury on CKD stage III -Monitor I&O's / urinary output -Follow BMP -Ensure adequate renal perfusion -Avoid nephrotoxic agents as able -Replace electrolytes as indicated      Webb Silversmith, DNP, CCRN, FNP-C [facility] Nurse Practitioner Between 7pm to 7am - Pager [phone]  After 7am go to [email or web address] - [email or web address]  [facility] [employer]  [phone]

## 2020-05-01 DEATH — deceased

## 2023-01-13 IMAGING — CT CT ANGIO CHEST
2 of 6 series · 18 of 46 positions shown · IV contrast (APPLIED)
Comparison: 04/02/2020

CLINICAL DATA: Altered level of consciousness, postop hip surgery,
hypoxia

EXAM:
CT ANGIOGRAPHY CHEST WITH CONTRAST
TECHNIQUE: Multidetector CT imaging of the chest was performed using the
standard protocol during bolus administration of intravenous
contrast. Multiplanar CT image reconstructions and MIPs were
obtained to evaluate the vascular anatomy.
CONTRAST:  60mL OMNIPAQUE IOHEXOL 350 MG/ML SOLN

[Series 5: thins · axial · 0.73mm/px · z∈[-12,+269]mm · 15 of 309 slices shown]
[im 14/309  lung]
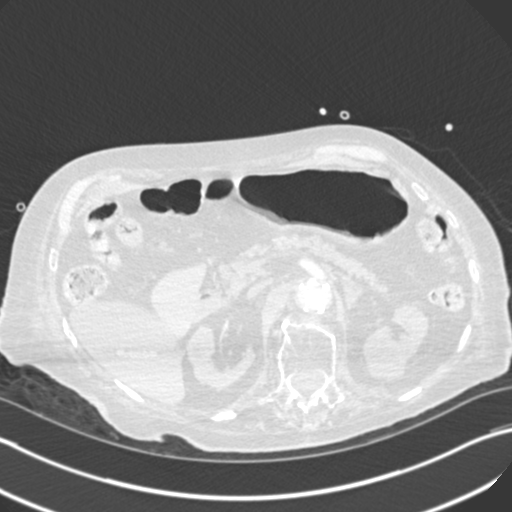
[im 41/309  soft-tissue]
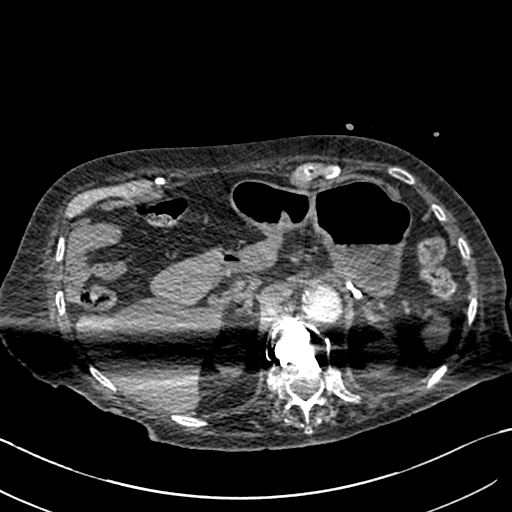
[im 54/309  lung]
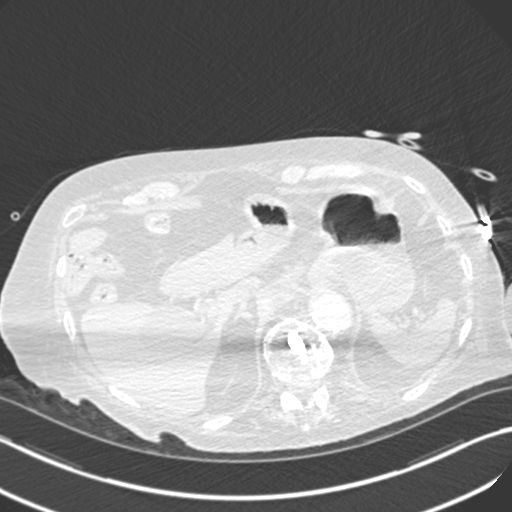
[im 81/309  soft-tissue]
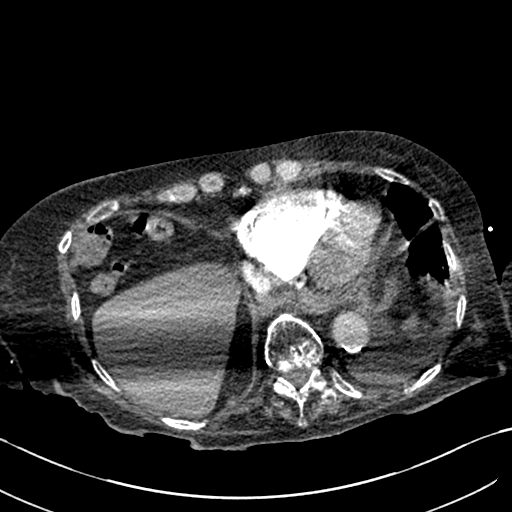
[im 94/309  lung]
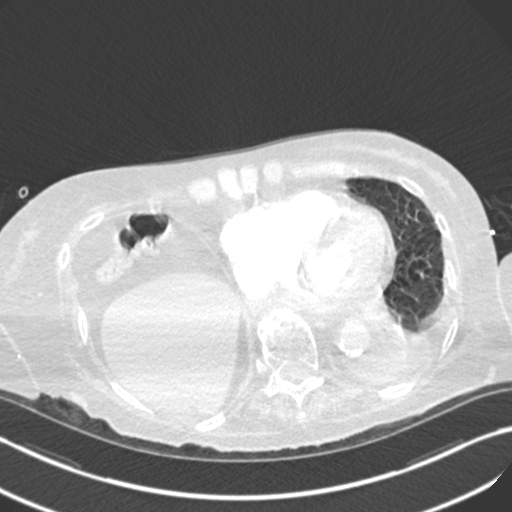
[im 121/309  soft-tissue]
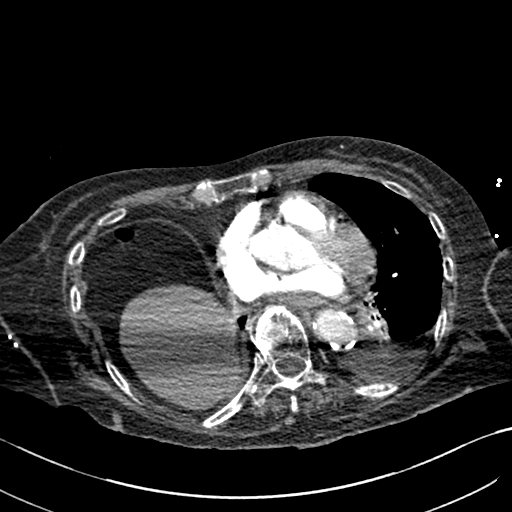
[im 134/309  lung]
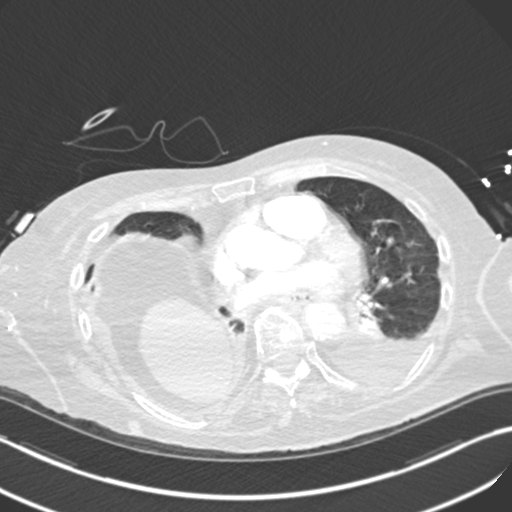
[im 161/309  soft-tissue]
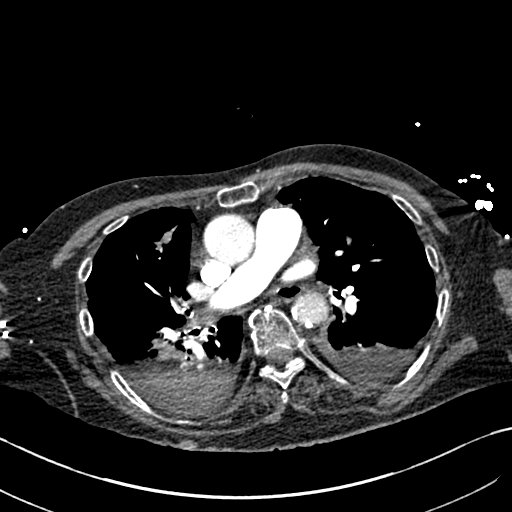
[im 175/309  lung]
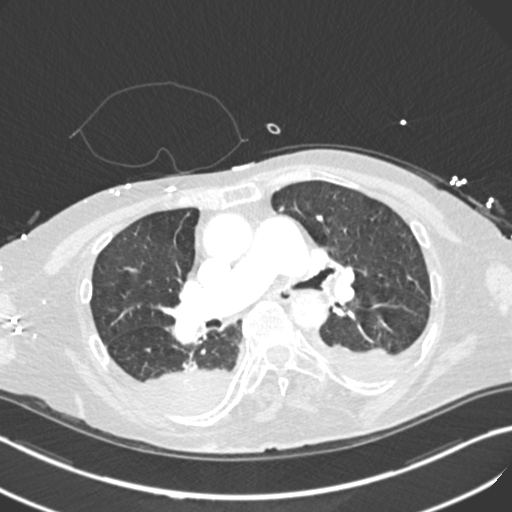
[im 188/309  soft-tissue]
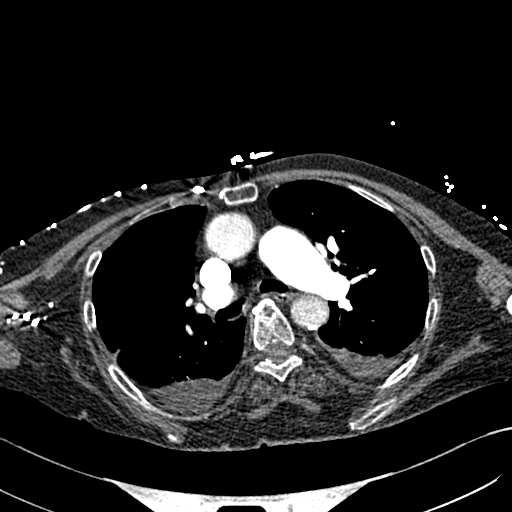
[im 215/309  lung]
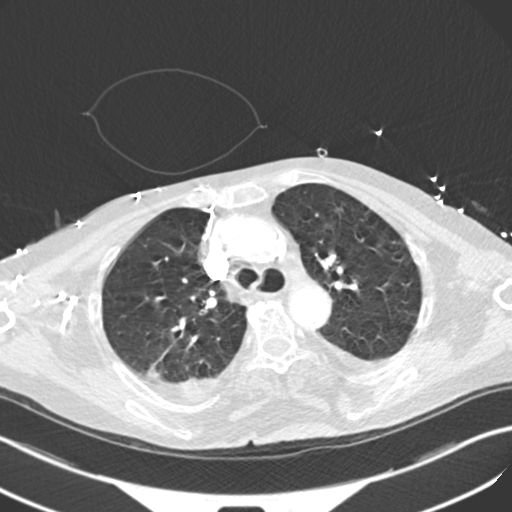
[im 228/309  soft-tissue]
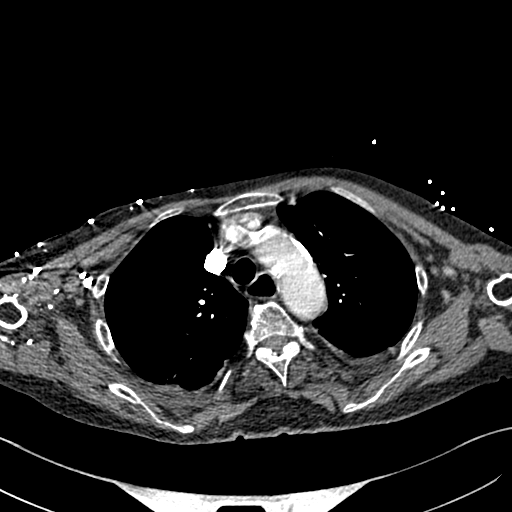
[im 255/309  lung]
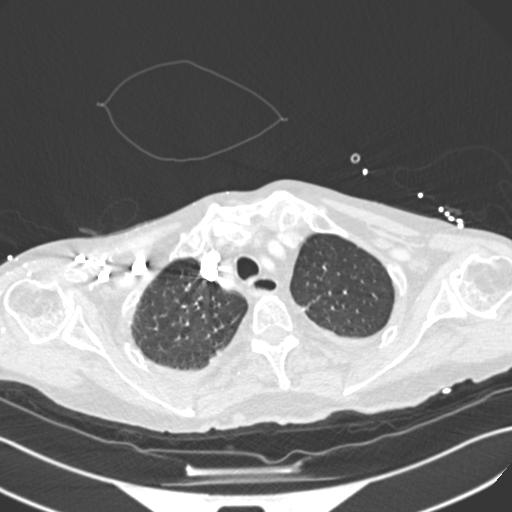
[im 268/309  soft-tissue]
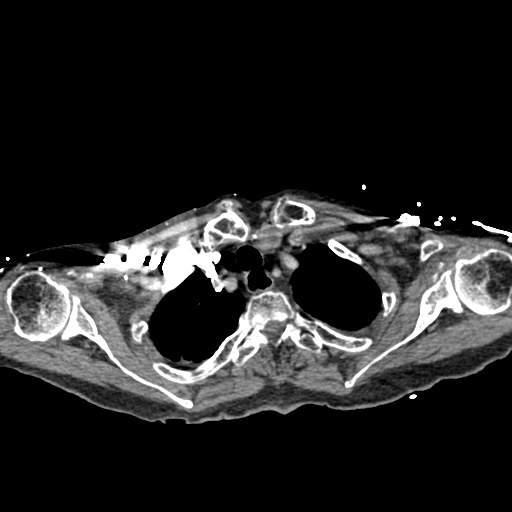
[im 295/309  lung]
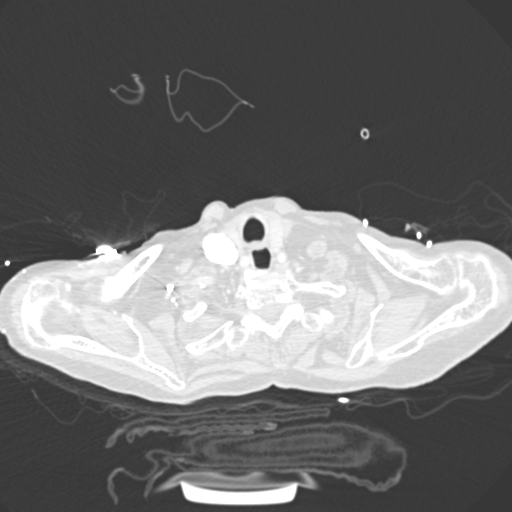

[Series 7: coronal mpr · coronal · 0.60mm/px · 3 of 72 slices shown]
[im 18/72  soft-tissue]
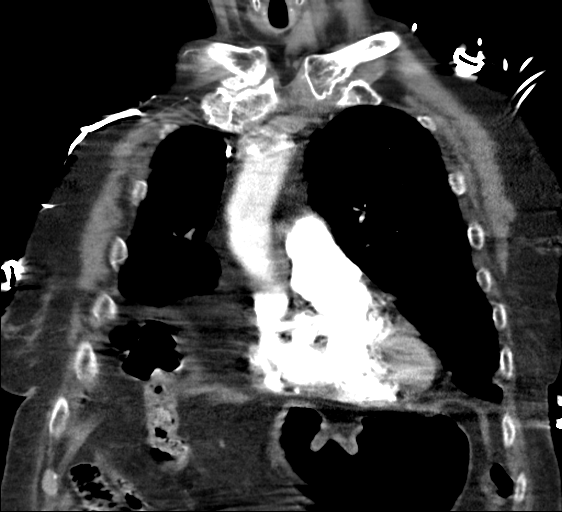
[im 36/72  soft-tissue]
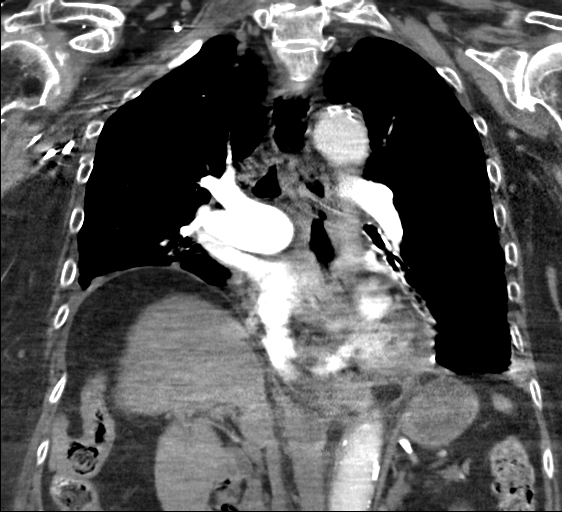
[im 54/72  soft-tissue]
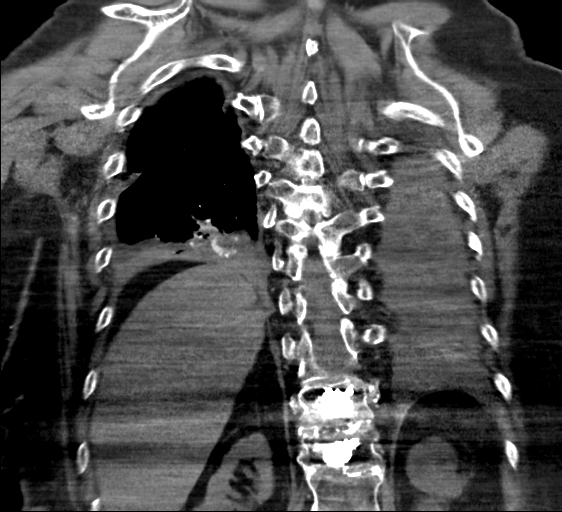

[18 of 46 positions shown; findings below may reference images not displayed]

FINDINGS: Cardiovascular: This is a technically adequate evaluation of the
pulmonary vasculature. No filling defects or pulmonary emboli. The
heart is unremarkable without pericardial effusion. Normal caliber
of the thoracic aorta, with no evidence of dissection. Diffuse
atherosclerosis of the aortic arch and descending thoracic aorta.

Mediastinum/Nodes: No enlarged mediastinal, hilar, or axillary lymph
nodes. Thyroid gland, trachea, and esophagus demonstrate no
significant findings.

Lungs/Pleura: There are small bilateral pleural effusions, with
areas of bibasilar atelectasis. No airspace disease or pneumothorax.
The central airways are patent.

Upper Abdomen: Calcified gallstones are partially visualized. No
evidence of acute cholecystitis. There is bilateral renal cortical
atrophy, with left renal cyst again noted.

Musculoskeletal: No acute or destructive bony lesions. Previous
vertebral augmentations within the lower thoracic spine.
Reconstructed images demonstrate no additional findings.

Review of the MIP images confirms the above findings.
IMPRESSION: 1. No evidence of pulmonary embolus.
2. Small bilateral pleural effusions with bibasilar atelectasis.
3. Cholelithiasis without cholecystitis.
4.  Aortic Atherosclerosis ([id number]).

## 2023-01-13 IMAGING — XA DG HIP (WITH PELVIS) OPERATIVE*R*
1 series · 1 of 1 positions shown · non-contrast
Comparison: None.

CLINICAL DATA: Hip arthroplasty

EXAM:
OPERATIVE right HIP (WITH PELVIS IF PERFORMED)  VIEWS
TECHNIQUE: Fluoroscopic spot image(s) were submitted for interpretation
post-operatively.

[Series 1: unknown protocol · 0.20mm/px · 1 of 1 slices shown]
[im 1/1]
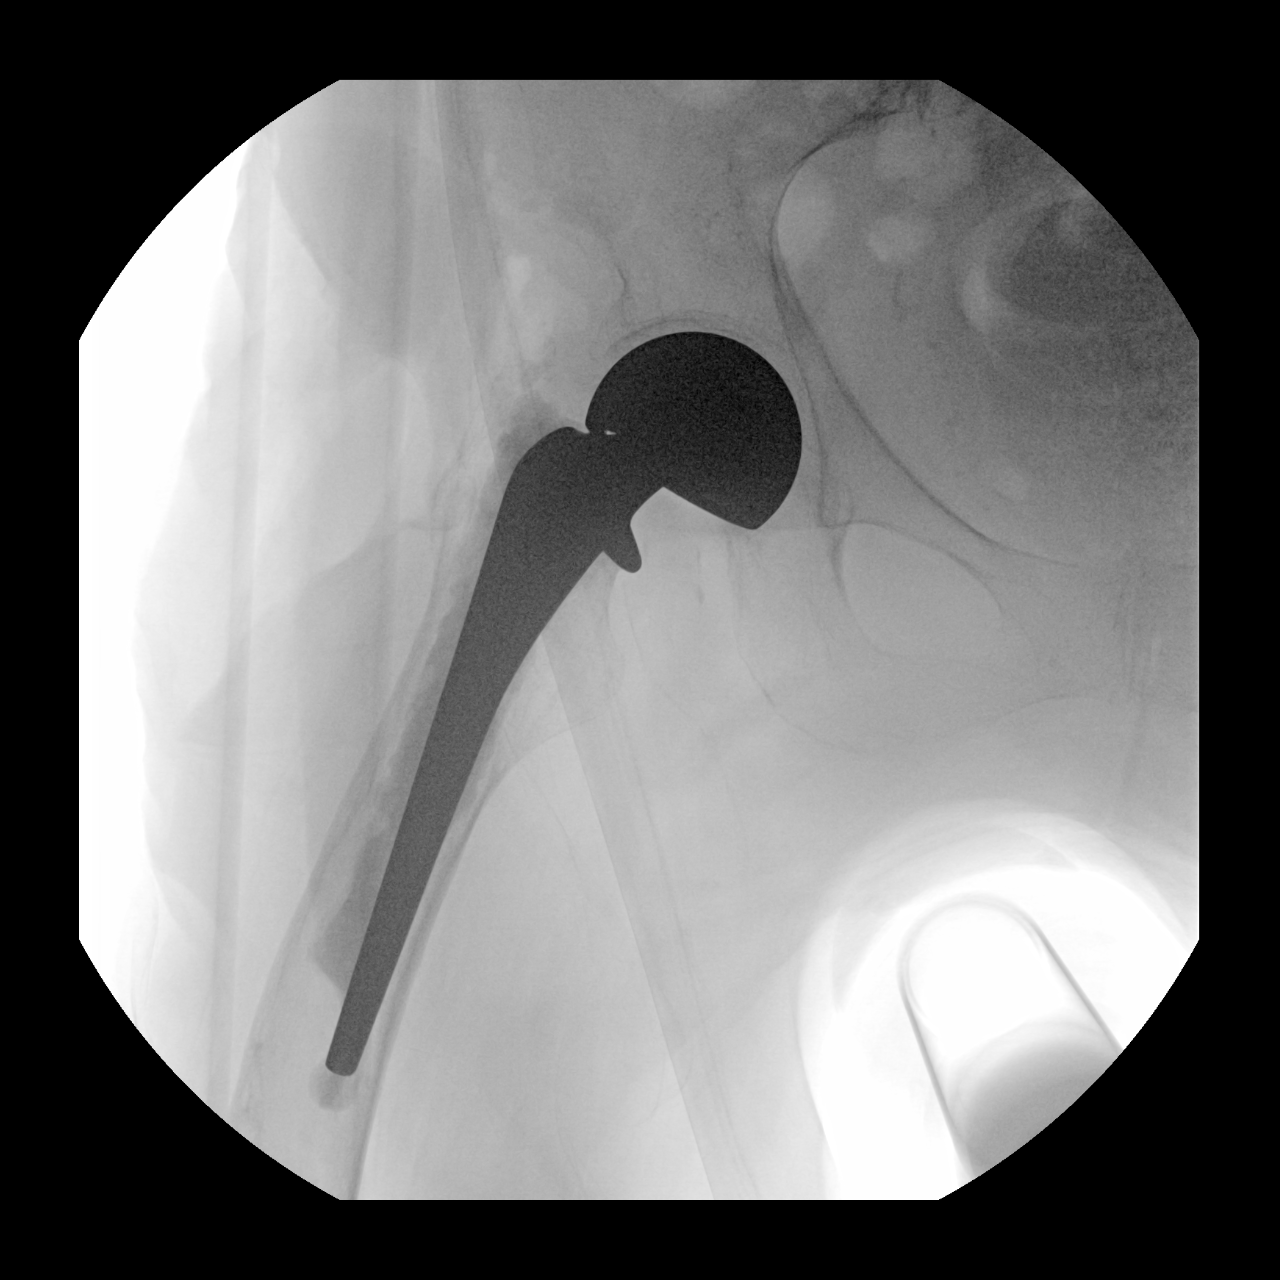

[1 of 1 positions shown; findings below may reference images not displayed]

FINDINGS: One intraop image was submitted for review of a right
hemiarthroplasty. Fluoro time 14 seconds
IMPRESSION: Intraop view of a right hemiarthroplasty.  Fluoro time 14 seconds
